# Patient Record
Sex: Female | Born: 1975 | Hispanic: Yes | Marital: Single | Smoking: Never smoker
Health system: Southern US, Community
[De-identification: ages and names within clinical notes are randomized; demographics above are authoritative.]

## PROBLEM LIST (undated history)

## (undated) DIAGNOSIS — Z8619 Personal history of other infectious and parasitic diseases: Secondary | ICD-10-CM

## (undated) DIAGNOSIS — D259 Leiomyoma of uterus, unspecified: Secondary | ICD-10-CM

## (undated) DIAGNOSIS — Z8669 Personal history of other diseases of the nervous system and sense organs: Secondary | ICD-10-CM

## (undated) HISTORY — DX: Personal history of other diseases of the nervous system and sense organs: Z86.69

## (undated) HISTORY — DX: Personal history of other infectious and parasitic diseases: Z86.19

## (undated) HISTORY — DX: Leiomyoma of uterus, unspecified: D25.9

---

## 2015-03-24 ENCOUNTER — Telehealth: Payer: Self-pay | Admitting: Behavioral Health

## 2015-03-24 NOTE — Telephone Encounter (Signed)
Spoke with patient regarding pre-visit info, however the time of call was not convient for the patient. Patient verbalized she will return call later.

## 2015-03-25 ENCOUNTER — Encounter: Payer: Self-pay | Admitting: Family Medicine

## 2015-03-25 ENCOUNTER — Other Ambulatory Visit: Payer: Self-pay | Admitting: Family Medicine

## 2015-03-25 ENCOUNTER — Ambulatory Visit (INDEPENDENT_AMBULATORY_CARE_PROVIDER_SITE_OTHER): Payer: BLUE CROSS/BLUE SHIELD | Admitting: Family Medicine

## 2015-03-25 ENCOUNTER — Ambulatory Visit: Payer: Self-pay | Admitting: Family Medicine

## 2015-03-25 VITALS — BP 122/82 | HR 84 | Temp 97.9°F | Resp 16 | Ht 64.5 in | Wt 202.4 lb

## 2015-03-25 DIAGNOSIS — Z23 Encounter for immunization: Secondary | ICD-10-CM | POA: Diagnosis not present

## 2015-03-25 DIAGNOSIS — Z0000 Encounter for general adult medical examination without abnormal findings: Secondary | ICD-10-CM | POA: Diagnosis not present

## 2015-03-25 LAB — CBC WITH DIFFERENTIAL/PLATELET
BASOS ABS: 0 10*3/uL (ref 0.0–0.1)
Basophils Relative: 0.4 % (ref 0.0–3.0)
Eosinophils Absolute: 0.1 10*3/uL (ref 0.0–0.7)
Eosinophils Relative: 0.9 % (ref 0.0–5.0)
HCT: 36.3 % (ref 36.0–46.0)
Hemoglobin: 11.9 g/dL — ABNORMAL LOW (ref 12.0–15.0)
LYMPHS ABS: 2.8 10*3/uL (ref 0.7–4.0)
Lymphocytes Relative: 39.1 % (ref 12.0–46.0)
MCHC: 32.7 g/dL (ref 30.0–36.0)
MCV: 87.7 fl (ref 78.0–100.0)
MONO ABS: 0.4 10*3/uL (ref 0.1–1.0)
Monocytes Relative: 6.1 % (ref 3.0–12.0)
Neutro Abs: 3.8 10*3/uL (ref 1.4–7.7)
Neutrophils Relative %: 53.5 % (ref 43.0–77.0)
Platelets: 202 10*3/uL (ref 150.0–400.0)
RBC: 4.14 Mil/uL (ref 3.87–5.11)
RDW: 14 % (ref 11.5–15.5)
WBC: 7.1 10*3/uL (ref 4.0–10.5)

## 2015-03-25 LAB — VITAMIN D 25 HYDROXY (VIT D DEFICIENCY, FRACTURES): VITD: 12.92 ng/mL — ABNORMAL LOW (ref 30.00–100.00)

## 2015-03-25 LAB — LIPID PANEL
CHOLESTEROL: 174 mg/dL (ref 0–200)
HDL: 57.9 mg/dL (ref >39.00)
LDL CALC: 106 mg/dL — AB (ref 0–99)
NONHDL: 116.1
Total CHOL/HDL Ratio: 3
Triglycerides: 51 mg/dL (ref 0.0–149.0)
VLDL: 10.2 mg/dL (ref 0.0–40.0)

## 2015-03-25 LAB — BASIC METABOLIC PANEL
BUN: 12 mg/dL (ref 6–23)
CO2: 27 mEq/L (ref 19–32)
Calcium: 9.3 mg/dL (ref 8.4–10.5)
Chloride: 103 mEq/L (ref 96–112)
Creatinine, Ser: 0.84 mg/dL (ref 0.40–1.20)
GFR: 79.36 mL/min (ref >60.00)
Glucose, Bld: 77 mg/dL (ref 70–99)
POTASSIUM: 3.7 meq/L (ref 3.5–5.1)
Sodium: 136 mEq/L (ref 135–145)

## 2015-03-25 LAB — TSH: TSH: 1.57 u[IU]/mL (ref 0.35–4.50)

## 2015-03-25 LAB — HEPATIC FUNCTION PANEL
ALK PHOS: 47 U/L (ref 39–117)
ALT: 18 U/L (ref 0–35)
AST: 18 U/L (ref 0–37)
Albumin: 4.2 g/dL (ref 3.5–5.2)
BILIRUBIN DIRECT: 0.1 mg/dL (ref 0.0–0.3)
BILIRUBIN TOTAL: 1.2 mg/dL (ref 0.2–1.2)
Total Protein: 7.3 g/dL (ref 6.0–8.3)

## 2015-03-25 NOTE — Progress Notes (Deleted)
Pre visit review using our clinic review tool, if applicable. No additional management support is needed unless otherwise documented below in the visit note. 

## 2015-03-25 NOTE — Patient Instructions (Signed)
Follow up in 1 year or as needed We'll notify you of your lab results and make any changes if needed Keep up the good work on healthy, low carb diet and regular exercise- you're doing great! Call your GYN and schedule your mammo Call with any questions or concerns Welcome!  We're glad to have you!!!

## 2015-03-25 NOTE — Progress Notes (Signed)
   Subjective:    Patient ID: Felicia Gutierrez, female    DOB: 04-11-1976, 39 y.o.   MRN: [id number]  HPI New to establish.  Previous MD- none  GYN- [facility], UTD on pap, has never had mammo.  No concerns today.   Review of Systems Patient reports no vision/ hearing changes, adenopathy,fever, weight change,  persistant/recurrent hoarseness , swallowing issues, chest pain, palpitations, edema, persistant/recurrent cough, hemoptysis, dyspnea (rest/exertional/paroxysmal nocturnal), gastrointestinal bleeding (melena, rectal bleeding), abdominal pain, significant heartburn, bowel changes, GU symptoms (dysuria, hematuria, incontinence), Gyn symptoms (abnormal  bleeding, pain),  syncope, focal weakness, memory loss, numbness & tingling, skin/hair/nail changes, abnormal bruising or bleeding, anxiety, or depression.     Objective:   Physical Exam General Appearance:    Alert, cooperative, no distress, appears stated age  Head:    Normocephalic, without obvious abnormality, atraumatic  Eyes:    PERRL, conjunctiva/corneas clear, EOM's intact, fundi    benign, both eyes  Ears:    Normal TM's and external ear canals, both ears  Nose:   Nares normal, septum midline, mucosa normal, no drainage    or sinus tenderness  Throat:   Lips, mucosa, and tongue normal; teeth and gums normal  Neck:   Supple, symmetrical, trachea midline, no adenopathy;    Thyroid: no enlargement/tenderness/nodules  Back:     Symmetric, no curvature, ROM normal, no CVA tenderness  Lungs:     Clear to auscultation bilaterally, respirations unlabored  Chest Wall:    No tenderness or deformity   Heart:    Regular rate and rhythm, S1 and S2 normal, no murmur, rub   or gallop  Breast Exam:    Deferred to GYN  Abdomen:     Soft, non-tender, bowel sounds active all four quadrants,    no masses, no organomegaly  Genitalia:    Deferred to GYN  Rectal:    Extremities:   Extremities normal, atraumatic, no cyanosis or edema  Pulses:   2+  and symmetric all extremities  Skin:   Skin color, texture, turgor normal, no rashes or lesions  Lymph nodes:   Cervical, supraclavicular, and axillary nodes normal  Neurologic:   CNII-XII intact, normal strength, sensation and reflexes    throughout          Assessment & Plan:

## 2015-03-25 NOTE — Progress Notes (Signed)
Pre visit review using our clinic review tool, if applicable. No additional management support is needed unless otherwise documented below in the visit note. 

## 2015-03-27 DIAGNOSIS — Z0000 Encounter for general adult medical examination without abnormal findings: Secondary | ICD-10-CM | POA: Insufficient documentation

## 2015-03-27 NOTE — Assessment & Plan Note (Signed)
Pt's PE WNL.  UTD on GYN (pap) but has never had a mammo.  Encouraged pt to call and schedule w/ GYN.  Check labs.  Anticipatory guidance provided.

## 2015-03-28 ENCOUNTER — Other Ambulatory Visit: Payer: Self-pay | Admitting: General Practice

## 2015-03-28 MED ORDER — VITAMIN D (ERGOCALCIFEROL) 1.25 MG (50000 UNIT) PO CAPS: 50000.0000 [IU] | ORAL_CAPSULE | ORAL | Status: DC

## 2016-03-30 ENCOUNTER — Ambulatory Visit (INDEPENDENT_AMBULATORY_CARE_PROVIDER_SITE_OTHER): Payer: BLUE CROSS/BLUE SHIELD | Admitting: Family Medicine

## 2016-03-30 ENCOUNTER — Encounter: Payer: Self-pay | Admitting: Family Medicine

## 2016-03-30 VITALS — BP 118/80 | HR 80 | Temp 98.0°F | Resp 16 | Ht 65.0 in | Wt 198.2 lb

## 2016-03-30 DIAGNOSIS — Z0000 Encounter for general adult medical examination without abnormal findings: Secondary | ICD-10-CM

## 2016-03-30 DIAGNOSIS — Z1231 Encounter for screening mammogram for malignant neoplasm of breast: Secondary | ICD-10-CM | POA: Diagnosis not present

## 2016-03-30 LAB — BASIC METABOLIC PANEL
BUN: 18 mg/dL (ref 6–23)
CO2: 27 mEq/L (ref 19–32)
Calcium: 9.6 mg/dL (ref 8.4–10.5)
Chloride: 103 mEq/L (ref 96–112)
Creatinine, Ser: 0.78 mg/dL (ref 0.40–1.20)
GFR: 86.02 mL/min (ref >60.00)
GLUCOSE: 88 mg/dL (ref 70–99)
POTASSIUM: 4.6 meq/L (ref 3.5–5.1)
Sodium: 136 mEq/L (ref 135–145)

## 2016-03-30 LAB — CBC WITH DIFFERENTIAL/PLATELET
Basophils Absolute: 0 10*3/uL (ref 0.0–0.1)
Basophils Relative: 0.3 % (ref 0.0–3.0)
EOS PCT: 1 % (ref 0.0–5.0)
Eosinophils Absolute: 0.1 10*3/uL (ref 0.0–0.7)
HCT: 36.8 % (ref 36.0–46.0)
Hemoglobin: 12.2 g/dL (ref 12.0–15.0)
LYMPHS ABS: 2.3 10*3/uL (ref 0.7–4.0)
Lymphocytes Relative: 39 % (ref 12.0–46.0)
MCHC: 33.1 g/dL (ref 30.0–36.0)
MCV: 87.6 fl (ref 78.0–100.0)
Monocytes Absolute: 0.4 10*3/uL (ref 0.1–1.0)
Monocytes Relative: 6.9 % (ref 3.0–12.0)
NEUTROS PCT: 52.8 % (ref 43.0–77.0)
Neutro Abs: 3.1 10*3/uL (ref 1.4–7.7)
Platelets: 180 10*3/uL (ref 150.0–400.0)
RBC: 4.2 Mil/uL (ref 3.87–5.11)
RDW: 14.4 % (ref 11.5–15.5)
WBC: 5.8 10*3/uL (ref 4.0–10.5)

## 2016-03-30 LAB — LIPID PANEL
CHOLESTEROL: 197 mg/dL (ref 0–200)
HDL: 62.5 mg/dL (ref >39.00)
LDL Cholesterol: 122 mg/dL — ABNORMAL HIGH (ref 0–99)
NONHDL: 134.42
Total CHOL/HDL Ratio: 3
Triglycerides: 61 mg/dL (ref 0.0–149.0)
VLDL: 12.2 mg/dL (ref 0.0–40.0)

## 2016-03-30 LAB — HEPATIC FUNCTION PANEL
ALT: 19 U/L (ref 0–35)
AST: 17 U/L (ref 0–37)
Albumin: 4.3 g/dL (ref 3.5–5.2)
Alkaline Phosphatase: 47 U/L (ref 39–117)
BILIRUBIN DIRECT: 0.1 mg/dL (ref 0.0–0.3)
BILIRUBIN TOTAL: 0.7 mg/dL (ref 0.2–1.2)
Total Protein: 7.3 g/dL (ref 6.0–8.3)

## 2016-03-30 LAB — VITAMIN D 25 HYDROXY (VIT D DEFICIENCY, FRACTURES): VITD: 21.25 ng/mL — ABNORMAL LOW (ref 30.00–100.00)

## 2016-03-30 LAB — TSH: TSH: 2.39 u[IU]/mL (ref 0.35–4.50)

## 2016-03-30 NOTE — Progress Notes (Signed)
Pre visit review using our clinic review tool, if applicable. No additional management support is needed unless otherwise documented below in the visit note. 

## 2016-03-30 NOTE — Patient Instructions (Signed)
Follow up in 1 year or as needed We'll notify you of your lab results and make any changes if needed Continue to work on healthy diet and regular exercise- you look great! Call with any questions or concerns Have a great summer!! 

## 2016-03-30 NOTE — Assessment & Plan Note (Signed)
Pt's PE WNL.  Due for pap- pt to schedule w/ GYN.  Due for mammo- referral placed.  Check labs.  Anticipatory guidance provided.

## 2016-03-30 NOTE — Progress Notes (Signed)
   Subjective:    Patient ID: Felicia Gutierrez, female    DOB: Jun 21, 1976, 40 y.o.   MRN: [id number]  HPI CPE- UTD on pap smear (Dr Owens Shark), due for [facility].   Review of Systems Patient reports no vision/ hearing changes, adenopathy,fever, weight change,  persistant/recurrent hoarseness , swallowing issues, chest pain, palpitations, edema, persistant/recurrent cough, hemoptysis, dyspnea (rest/exertional/paroxysmal nocturnal), gastrointestinal bleeding (melena, rectal bleeding), abdominal pain, significant heartburn, bowel changes, GU symptoms (dysuria, hematuria, incontinence), Gyn symptoms (abnormal  bleeding, pain),  syncope, focal weakness, memory loss, numbness & tingling, skin/hair/nail changes, abnormal bruising or bleeding, anxiety, or depression.     Objective:   Physical Exam General Appearance:    Alert, cooperative, no distress, appears stated age  Head:    Normocephalic, without obvious abnormality, atraumatic  Eyes:    PERRL, conjunctiva/corneas clear, EOM's intact, fundi    benign, both eyes  Ears:    Normal TM's and external ear canals, both ears  Nose:   Nares normal, septum midline, mucosa normal, no drainage    or sinus tenderness  Throat:   Lips, mucosa, and tongue normal; teeth and gums normal  Neck:   Supple, symmetrical, trachea midline, no adenopathy;    Thyroid: no enlargement/tenderness/nodules  Back:     Symmetric, no curvature, ROM normal, no CVA tenderness  Lungs:     Clear to auscultation bilaterally, respirations unlabored  Chest Wall:    No tenderness or deformity   Heart:    Regular rate and rhythm, S1 and S2 normal, no murmur, rub   or gallop  Breast Exam:    Deferred to GYN  Abdomen:     Soft, non-tender, bowel sounds active all four quadrants,    no masses, no organomegaly  Genitalia:    Deferred to GYN  Rectal:    Extremities:   Extremities normal, atraumatic, no cyanosis or edema  Pulses:   2+ and symmetric all extremities  Skin:   Skin  color, texture, turgor normal, no rashes or lesions  Lymph nodes:   Cervical, supraclavicular, and axillary nodes normal  Neurologic:   CNII-XII intact, normal strength, sensation and reflexes    throughout          Assessment & Plan:

## 2016-04-04 ENCOUNTER — Other Ambulatory Visit: Payer: Self-pay | Admitting: General Practice

## 2016-04-04 MED ORDER — VITAMIN D (ERGOCALCIFEROL) 1.25 MG (50000 UNIT) PO CAPS: 50000.0000 [IU] | ORAL_CAPSULE | ORAL | Status: DC

## 2016-04-04 NOTE — Progress Notes (Signed)
Quick Note:  Called pt and lmovm to return call. ______ 

## 2016-04-20 ENCOUNTER — Ambulatory Visit
Admission: RE | Admit: 2016-04-20 | Discharge: 2016-04-20 | Disposition: A | Discharge status: Home / Self Care | Payer: BLUE CROSS/BLUE SHIELD | Source: Ambulatory Visit | Attending: Family Medicine | Admitting: Family Medicine

## 2016-04-20 DIAGNOSIS — Z1231 Encounter for screening mammogram for malignant neoplasm of breast: Secondary | ICD-10-CM

## 2017-04-01 ENCOUNTER — Encounter: Payer: BLUE CROSS/BLUE SHIELD | Admitting: Family Medicine

## 2017-04-05 ENCOUNTER — Encounter: Payer: BLUE CROSS/BLUE SHIELD | Admitting: Family Medicine

## 2017-04-26 ENCOUNTER — Ambulatory Visit (INDEPENDENT_AMBULATORY_CARE_PROVIDER_SITE_OTHER): Payer: BLUE CROSS/BLUE SHIELD | Admitting: Family Medicine

## 2017-04-26 ENCOUNTER — Encounter: Payer: Self-pay | Admitting: Family Medicine

## 2017-04-26 VITALS — BP 118/78 | HR 79 | Temp 98.1°F | Resp 16 | Ht 65.0 in | Wt 203.4 lb

## 2017-04-26 DIAGNOSIS — E559 Vitamin D deficiency, unspecified: Secondary | ICD-10-CM | POA: Diagnosis not present

## 2017-04-26 DIAGNOSIS — E669 Obesity, unspecified: Secondary | ICD-10-CM | POA: Diagnosis not present

## 2017-04-26 DIAGNOSIS — Z0000 Encounter for general adult medical examination without abnormal findings: Secondary | ICD-10-CM | POA: Diagnosis not present

## 2017-04-26 DIAGNOSIS — E010 Iodine-deficiency related diffuse (endemic) goiter: Secondary | ICD-10-CM | POA: Diagnosis not present

## 2017-04-26 LAB — VITAMIN D 25 HYDROXY (VIT D DEFICIENCY, FRACTURES): VITD: 21.08 ng/mL — AB (ref 30.00–100.00)

## 2017-04-26 LAB — BASIC METABOLIC PANEL
BUN: 15 mg/dL (ref 6–23)
CHLORIDE: 104 meq/L (ref 96–112)
CO2: 26 meq/L (ref 19–32)
Calcium: 9.5 mg/dL (ref 8.4–10.5)
Creatinine, Ser: 0.84 mg/dL (ref 0.40–1.20)
GFR: 78.57 mL/min (ref >60.00)
GLUCOSE: 83 mg/dL (ref 70–99)
POTASSIUM: 4.4 meq/L (ref 3.5–5.1)
SODIUM: 137 meq/L (ref 135–145)

## 2017-04-26 LAB — CBC WITH DIFFERENTIAL/PLATELET
Basophils Absolute: 0.1 10*3/uL (ref 0.0–0.1)
Basophils Relative: 1.3 % (ref 0.0–3.0)
EOS ABS: 0.1 10*3/uL (ref 0.0–0.7)
EOS PCT: 1.5 % (ref 0.0–5.0)
HEMATOCRIT: 36.6 % (ref 36.0–46.0)
HEMOGLOBIN: 11.8 g/dL — AB (ref 12.0–15.0)
LYMPHS ABS: 2.2 10*3/uL (ref 0.7–4.0)
Lymphocytes Relative: 44.1 % (ref 12.0–46.0)
MCHC: 32.3 g/dL (ref 30.0–36.0)
MCV: 90.8 fl (ref 78.0–100.0)
MONO ABS: 0.4 10*3/uL (ref 0.1–1.0)
MONOS PCT: 7.6 % (ref 3.0–12.0)
NEUTROS ABS: 2.3 10*3/uL (ref 1.4–7.7)
Neutrophils Relative %: 45.5 % (ref 43.0–77.0)
Platelets: 187 10*3/uL (ref 150.0–400.0)
RBC: 4.03 Mil/uL (ref 3.87–5.11)
RDW: 14.4 % (ref 11.5–15.5)
WBC: 5.1 10*3/uL (ref 4.0–10.5)

## 2017-04-26 LAB — HEPATIC FUNCTION PANEL
ALBUMIN: 4.1 g/dL (ref 3.5–5.2)
ALK PHOS: 43 U/L (ref 39–117)
ALT: 14 U/L (ref 0–35)
AST: 13 U/L (ref 0–37)
BILIRUBIN DIRECT: 0.1 mg/dL (ref 0.0–0.3)
TOTAL PROTEIN: 6.9 g/dL (ref 6.0–8.3)
Total Bilirubin: 0.7 mg/dL (ref 0.2–1.2)

## 2017-04-26 LAB — LIPID PANEL
Cholesterol: 163 mg/dL (ref 0–200)
HDL: 55.5 mg/dL (ref >39.00)
LDL Cholesterol: 100 mg/dL — ABNORMAL HIGH (ref 0–99)
NonHDL: 107.95
Total CHOL/HDL Ratio: 3
Triglycerides: 40 mg/dL (ref 0.0–149.0)
VLDL: 8 mg/dL (ref 0.0–40.0)

## 2017-04-26 LAB — TSH: TSH: 2.07 u[IU]/mL (ref 0.35–4.50)

## 2017-04-26 NOTE — Progress Notes (Signed)
   Subjective:    Patient ID: Felicia Gutierrez, female    DOB: 08/08/1976, 41 y.o.   MRN: [id number]  HPI CPE- UTD on Tdap, due for mammo and pap [facility]).  Pt has hx of Vit D deficiency.   Review of Systems Patient reports no vision/ hearing changes, adenopathy,fever, weight change,  persistant/recurrent hoarseness , swallowing issues, chest pain, palpitations, edema, persistant/recurrent cough, hemoptysis, dyspnea (rest/exertional/paroxysmal nocturnal), gastrointestinal bleeding (melena, rectal bleeding), abdominal pain, significant heartburn, bowel changes, GU symptoms (dysuria, hematuria, incontinence), Gyn symptoms (abnormal  bleeding, pain),  syncope, focal weakness, memory loss, numbness & tingling, skin/hair/nail changes, abnormal bruising or bleeding, anxiety, or depression.     Objective:   Physical Exam General Appearance:    Alert, cooperative, no distress, appears stated age  Head:    Normocephalic, without obvious abnormality, atraumatic  Eyes:    PERRL, conjunctiva/corneas clear, EOM's intact, fundi    benign, both eyes  Ears:    Normal TM's and external ear canals, both ears  Nose:   Nares normal, septum midline, mucosa normal, no drainage    or sinus tenderness  Throat:   Lips, mucosa, and tongue normal; teeth and gums normal  Neck:   Supple, symmetrical, trachea midline, no adenopathy;    Thyroid: thyromegaly w/o nodularity  Back:     Symmetric, no curvature, ROM normal, no CVA tenderness  Lungs:     Clear to auscultation bilaterally, respirations unlabored  Chest Wall:    No tenderness or deformity   Heart:    Regular rate and rhythm, S1 and S2 normal, no murmur, rub   or gallop  Breast Exam:    Deferred to GYN  Abdomen:     Soft, non-tender, bowel sounds active all four quadrants,    no masses, no organomegaly  Genitalia:    Deferred to GYN  Rectal:    Extremities:   Extremities normal, atraumatic, no cyanosis or edema  Pulses:   2+ and symmetric  all extremities  Skin:   Skin color, texture, turgor normal, no rashes or lesions  Lymph nodes:   Cervical, supraclavicular, and axillary nodes normal  Neurologic:   CNII-XII intact, normal strength, sensation and reflexes    throughout          Assessment & Plan:

## 2017-04-26 NOTE — Patient Instructions (Signed)
Follow up in 1 year or as needed We'll notify you of your lab results and make any changes if needed We'll call you with your thyroid US appt at our [place] in [place] Call and schedule your pap and mammo Continue to work on healthy diet and regular exercise- you can do it! Call with any questions or concerns CONGRATS ON THE PROMOTION!!!

## 2017-04-26 NOTE — Assessment & Plan Note (Signed)
Pt's PE WNL w/ exception of thyromegaly and obesity.  Overdue on GYN- pt to call and schedule.  Check labs.  Anticipatory guidance provided.

## 2017-04-26 NOTE — Assessment & Plan Note (Signed)
Pt has gained weight since last visit.  Encouraged healthy diet and regular exercise.  Check labs to risk stratify.  Will follow.

## 2017-04-26 NOTE — Assessment & Plan Note (Signed)
Pt has hx of this.  Check labs.  Replete prn. 

## 2017-04-26 NOTE — Progress Notes (Signed)
Pre visit review using our clinic review tool, if applicable. No additional management support is needed unless otherwise documented below in the visit note. 

## 2017-04-29 ENCOUNTER — Other Ambulatory Visit: Payer: Self-pay | Admitting: General Practice

## 2017-04-29 MED ORDER — VITAMIN D (ERGOCALCIFEROL) 1.25 MG (50000 UNIT) PO CAPS: 50000.0000 [IU] | ORAL_CAPSULE | ORAL | 0 refills | Status: DC

## 2017-05-01 ENCOUNTER — Ambulatory Visit (INDEPENDENT_AMBULATORY_CARE_PROVIDER_SITE_OTHER): Payer: BLUE CROSS/BLUE SHIELD

## 2017-05-01 DIAGNOSIS — E010 Iodine-deficiency related diffuse (endemic) goiter: Secondary | ICD-10-CM

## 2017-07-21 ENCOUNTER — Other Ambulatory Visit: Payer: Self-pay | Admitting: Family Medicine

## 2018-04-01 ENCOUNTER — Other Ambulatory Visit: Payer: Self-pay | Admitting: Family Medicine

## 2018-04-01 DIAGNOSIS — Z1231 Encounter for screening mammogram for malignant neoplasm of breast: Secondary | ICD-10-CM

## 2018-04-28 ENCOUNTER — Other Ambulatory Visit: Payer: Self-pay

## 2018-04-28 ENCOUNTER — Ambulatory Visit (INDEPENDENT_AMBULATORY_CARE_PROVIDER_SITE_OTHER): Payer: BLUE CROSS/BLUE SHIELD | Admitting: Family Medicine

## 2018-04-28 ENCOUNTER — Other Ambulatory Visit (HOSPITAL_COMMUNITY)
Admission: RE | Admit: 2018-04-28 | Discharge: 2018-04-28 | Disposition: A | Discharge status: Home / Self Care | Payer: BLUE CROSS/BLUE SHIELD | Source: Ambulatory Visit | Attending: Family Medicine | Admitting: Family Medicine

## 2018-04-28 ENCOUNTER — Ambulatory Visit
Admission: RE | Admit: 2018-04-28 | Discharge: 2018-04-28 | Disposition: A | Discharge status: Home / Self Care | Payer: BLUE CROSS/BLUE SHIELD | Source: Ambulatory Visit | Attending: Family Medicine | Admitting: Family Medicine

## 2018-04-28 ENCOUNTER — Encounter: Payer: Self-pay | Admitting: Family Medicine

## 2018-04-28 VITALS — BP 118/74 | HR 76 | Temp 97.6°F | Ht 64.0 in | Wt 182.4 lb

## 2018-04-28 DIAGNOSIS — Z1231 Encounter for screening mammogram for malignant neoplasm of breast: Secondary | ICD-10-CM | POA: Diagnosis not present

## 2018-04-28 DIAGNOSIS — Z124 Encounter for screening for malignant neoplasm of cervix: Secondary | ICD-10-CM | POA: Insufficient documentation

## 2018-04-28 DIAGNOSIS — Z0000 Encounter for general adult medical examination without abnormal findings: Secondary | ICD-10-CM | POA: Diagnosis not present

## 2018-04-28 DIAGNOSIS — Z3009 Encounter for other general counseling and advice on contraception: Secondary | ICD-10-CM | POA: Diagnosis not present

## 2018-04-28 DIAGNOSIS — Z975 Presence of (intrauterine) contraceptive device: Secondary | ICD-10-CM

## 2018-04-28 LAB — CBC WITH DIFFERENTIAL/PLATELET
BASOS ABS: 0 10*3/uL (ref 0.0–0.1)
BASOS PCT: 0.5 % (ref 0.0–3.0)
EOS PCT: 0.7 % (ref 0.0–5.0)
Eosinophils Absolute: 0 10*3/uL (ref 0.0–0.7)
HCT: 38.5 % (ref 36.0–46.0)
Hemoglobin: 12.8 g/dL (ref 12.0–15.0)
LYMPHS ABS: 2.3 10*3/uL (ref 0.7–4.0)
Lymphocytes Relative: 46.4 % — ABNORMAL HIGH (ref 12.0–46.0)
MCHC: 33.2 g/dL (ref 30.0–36.0)
MCV: 90.6 fl (ref 78.0–100.0)
MONOS PCT: 7 % (ref 3.0–12.0)
Monocytes Absolute: 0.3 10*3/uL (ref 0.1–1.0)
NEUTROS ABS: 2.3 10*3/uL (ref 1.4–7.7)
NEUTROS PCT: 45.4 % (ref 43.0–77.0)
PLATELETS: 156 10*3/uL (ref 150.0–400.0)
RBC: 4.25 Mil/uL (ref 3.87–5.11)
RDW: 13.6 % (ref 11.5–15.5)
WBC: 5 10*3/uL (ref 4.0–10.5)

## 2018-04-28 NOTE — Patient Instructions (Addendum)
Please return in 12 months for your annual complete physical; please come fasting, with Dr. Birdie Riddle  We will call you with information regarding your referral appointment. [facility] for IUD removal and replacement.  If you do not hear from [place] within the next 2 weeks, please let me know. It can take 1-2 weeks to get appointments set up with the specialists.    If you have any questions or concerns, please don't hesitate to send me a message via MyChart or call the office at [phone]. Thank you for visiting with [place] today! It's our pleasure caring for you.  Please do these things to maintain good health!   Exercise at least 30-45 minutes a day,  4-5 days a week.   Eat a low-fat diet with lots of fruits and vegetables, up to 7-9 servings per day.  Drink plenty of water daily. Try to drink 8 8oz glasses per day.  Seatbelts can save your life. Always wear your seatbelt.  Place Smoke Detectors on every level of your home and check batteries every year.  Schedule an appointment with an eye doctor for an eye exam every 1-2 years  Safe sex - use condoms to protect yourself from STDs if you could be exposed to these types of infections. Use birth control if you do not want to become pregnant and are sexually active.  Avoid heavy alcohol use. If you drink, keep it to less than 2 drinks/day and not every day.  [place].  Choose someone you trust that could speak for you if you became unable to speak for yourself.  Depression is common in our stressful world.If you're feeling down or losing interest in things you normally enjoy, please come in for a visit.  If anyone is threatening or hurting you, please get help. Physical or Emotional Violence is never OK.

## 2018-04-28 NOTE — Progress Notes (Signed)
Subjective  Chief Complaint  Patient presents with  . Annual Exam    doing well, no complaints. patient is fasting. Pap today     HPI: Felicia Gutierrez is a 42 y.o. female who presents to [place] [facility] today for a Female Wellness Visit.   Wellness Visit: annual visit with health maintenance review and exam with Pap   Pt of Dr. Birdie Riddle; doing well w/o concerns. Due for pap. Reports has mirena IUD in place; due out last year. Would like replacement. Has 2 teen boys.   Assessment  1. Annual physical exam   2. Cervical cancer screening   3. General counseling and advice for contraceptive management   4. IUD (intrauterine device) in place      Plan  Female Wellness Visit:  Age appropriate Health Maintenance and Prevention measures were discussed with patient. Included topics are cancer screening recommendations, ways to keep healthy (see AVS) including dietary and exercise recommendations, regular eye and dental care, use of seat belts, and avoidance of moderate alcohol use and tobacco use. Await pap with HPV screen. Mammogram scheduled for tomorrow.   BMI: discussed patient's BMI and encouraged positive lifestyle modifications to help get to or maintain a target BMI.  HM needs and immunizations were addressed and ordered. See below for orders. See HM and immunization section for updates.  Routine labs and screening tests ordered including cmp, cbc and lipids where appropriate.  Discussed recommendations regarding Vit D and calcium supplementation (see AVS)  rec Gyn appt for IUD removal and replacement since can't see strings. Discussed need for back up method in the meantime.   Follow up: Return in about 1 year (around 04/29/2019) for complete physical.   Orders Placed This Encounter  Procedures  . CBC with Differential/Platelet  . Comprehensive metabolic panel  . Lipid panel  . Ambulatory referral to Obstetrics / Gynecology   No orders  of the defined types were placed in this encounter.     Lifestyle: Body mass index is 31.31 kg/m. Wt Readings from Last 3 Encounters:  04/28/18 182 lb 6.4 oz (82.7 kg)  04/26/17 203 lb 6 oz (92.3 kg)  03/30/16 198 lb 4 oz (89.9 kg)   Diet: general Exercise: infrequently,  Need for contraception: Yes, IUD  Patient Active Problem List   Diagnosis Date Noted  . Vitamin D deficiency 04/26/2017  . Obesity (BMI 30-39.9) 04/26/2017  . Physical exam 03/27/2015   Health Maintenance  Topic Date Due  . PAP SMEAR  10/02/2015  . MAMMOGRAM  04/20/2017  . INFLUENZA VACCINE  05/01/2018  . TETANUS/TDAP  03/24/2025  . HIV Screening  Completed   Immunization History  Administered Date(s) Administered  . Tdap 03/25/2015   We updated and reviewed the patient's past history in detail and it is documented below. Allergies: Patient has No Known Allergies. Past Medical History Patient  has a past medical history of Fibroid uterus, History of chicken pox, and migraines. Past Surgical History Patient  has no past surgical history on file. Family History: Patient family history includes Cancer in her maternal aunt and mother; Cancer (age of onset: 8) in her sister; Diabetes in her maternal aunt; Heart disease in her mother; Hypertension in her father. Social History:  Patient  reports that she has never smoked. She has never used smokeless tobacco. She reports that she drinks alcohol. She reports that she does not use drugs.  Review of Systems: Constitutional: negative for fever or malaise Ophthalmic: negative for photophobia,  double vision or loss of vision Cardiovascular: negative for chest pain, dyspnea on exertion, or new LE swelling Respiratory: negative for SOB or persistent cough Gastrointestinal: negative for abdominal pain, change in bowel habits or melena Genitourinary: negative for dysuria or gross hematuria, no abnormal uterine bleeding or disharge Musculoskeletal: negative for  new gait disturbance or muscular weakness Integumentary: negative for new or persistent rashes, no breast lumps Neurological: negative for TIA or stroke symptoms Psychiatric: negative for SI or delusions Allergic/Immunologic: negative for hives Patient Care Team    Relationship Specialty Notifications Start End  Midge Minium, MD PCP - General Family Medicine  01/07/15   Doroteo Glassman, MD Referring Physician Obstetrics and Gynecology  03/25/15     Objective  Vitals: BP 118/74   Pulse 76   Temp 97.6 F (36.4 C)   Ht 5\' 4"  (1.626 m)   Wt 182 lb 6.4 oz (82.7 kg)   LMP 04/13/2018   SpO2 99%   BMI 31.31 kg/m  General:  Well developed, well nourished, no acute distress  Psych:  Alert and orientedx3,normal mood and affect HEENT:  Normocephalic, atraumatic, non-icteric sclera, PERRL, oropharynx is clear without mass or exudate, supple neck without adenopathy, mass or thyromegaly Cardiovascular:  Normal S1, S2, RRR without gallop, rub or murmur, nondisplaced PMI Respiratory:  Good breath sounds bilaterally, CTAB with normal respiratory effort Gastrointestinal: normal bowel sounds, soft, non-tender, no noted masses. No HSM MSK: no deformities, contusions. Joints are without erythema or swelling. Spine and CVA region are nontender Skin:  Warm, no rashes or suspicious lesions noted Neurologic:    Mental status is normal. CN 2-11 are normal. Gross motor and sensory exams are normal. Normal gait. No tremor Breast Exam: No mass, skin retraction or nipple discharge is appreciated in either breast. No axillary adenopathy. Fibrocystic changes are not noted Pelvic Exam: Normal external genitalia, no vulvar or vaginal lesions present. Clear cervix w/o CMT. do not see IUD strings. Bimanual exam reveals a nontender fundus w/o masses, nl size. No adnexal masses present. No inguinal adenopathy. A PAP smear was not performed.    Commons side effects, risks, benefits, and alternatives for medications and  treatment plan prescribed today were discussed, and the patient expressed understanding of the given instructions. Patient is instructed to call or message via MyChart if he/she has any questions or concerns regarding our treatment plan. No barriers to understanding were identified. We discussed Red Flag symptoms and signs in detail. Patient expressed understanding regarding what to do in case of urgent or emergency type symptoms.   Medication list was reconciled, printed and provided to the patient in AVS. Patient instructions and summary information was reviewed with the patient as documented in the AVS. This note was prepared with assistance of Dragon voice recognition software. Occasional wrong-word or sound-a-like substitutions may have occurred due to the inherent limitations of voice recognition software

## 2018-04-29 ENCOUNTER — Telehealth: Payer: Self-pay | Admitting: General Practice

## 2018-04-29 LAB — CYTOLOGY - PAP
Diagnosis: NEGATIVE
HPV: NOT DETECTED

## 2018-04-29 NOTE — Telephone Encounter (Signed)
Called and spoke with pt. She asked if when pap results come back if they coud be faxed to [facility] in [facility] attn: Dr Grandville Silos. Pt has an appointment with them in December and they are requesting documentation of annual exam and pap for their records.   Copied from [place] [phone]. Topic: Referral - Status >> Apr 29, 2018  8:46 AM Bea Graff, NT wrote: Reason for CRM: Pt states she called her OBGYN and was to call the office when she did this. Requesting a callback.

## 2018-04-30 ENCOUNTER — Telehealth: Payer: Self-pay | Admitting: Emergency Medicine

## 2018-04-30 DIAGNOSIS — E669 Obesity, unspecified: Secondary | ICD-10-CM

## 2018-04-30 DIAGNOSIS — Z0000 Encounter for general adult medical examination without abnormal findings: Secondary | ICD-10-CM

## 2018-04-30 NOTE — Addendum Note (Signed)
Addended by: Leonidas Romberg on: 04/30/2018 09:47 AM   Modules accepted: Orders

## 2018-04-30 NOTE — Telephone Encounter (Signed)
Called patient and left a message on VM advising patient that the labs drawn on Monday were hemolyzed and needed to redrawn. She will not be charged for the redraw. Lab orders placed in chart. Patient just needs to schedule a lab appointment at our location or [facility] location or walk in at [place] location. CRM created and ok for PEC to advise and schedule appointment

## 2018-05-01 NOTE — Progress Notes (Signed)
Please call patient: I have reviewed his/her lab results. Please let patient know her blood counts and pap are both normal. Can repeat the pap smear in 5 years.  Can return for lipids and cmp as scheduled. Thanks.

## 2018-05-01 NOTE — Addendum Note (Signed)
Addended by: Leonidas Romberg on: 05/01/2018 01:50 PM   Modules accepted: Orders

## 2018-05-01 NOTE — Telephone Encounter (Signed)
Report Sent to [facility]

## 2018-05-01 NOTE — Telephone Encounter (Signed)
Spoke with patient and advised for her convience we can have labs drawn at [facility] in [facility].  She is agreeable and no appointment is needed. Reordered the labs for [facility] and will fax over to them.

## 2018-05-02 DIAGNOSIS — Z0000 Encounter for general adult medical examination without abnormal findings: Secondary | ICD-10-CM | POA: Diagnosis not present

## 2018-05-02 DIAGNOSIS — E669 Obesity, unspecified: Secondary | ICD-10-CM | POA: Diagnosis not present

## 2018-05-03 LAB — COMPREHENSIVE METABOLIC PANEL
ALK PHOS: 45 IU/L (ref 39–117)
ALT: 15 IU/L (ref 0–32)
AST: 19 IU/L (ref 0–40)
Albumin/Globulin Ratio: 1.7 (ref 1.2–2.2)
Albumin: 4.3 g/dL (ref 3.5–5.5)
BUN/Creatinine Ratio: 14 (ref 9–23)
BUN: 13 mg/dL (ref 6–24)
Bilirubin Total: 0.9 mg/dL (ref 0.0–1.2)
CO2: 20 mmol/L (ref 20–29)
CREATININE: 0.96 mg/dL (ref 0.57–1.00)
Calcium: 9.5 mg/dL (ref 8.7–10.2)
Chloride: 101 mmol/L (ref 96–106)
GFR calc Af Amer: 83 mL/min/{1.73_m2} (ref >59)
GFR calc non Af Amer: 72 mL/min/{1.73_m2} (ref >59)
GLUCOSE: 93 mg/dL (ref 65–99)
Globulin, Total: 2.6 g/dL (ref 1.5–4.5)
Potassium: 4.3 mmol/L (ref 3.5–5.2)
Sodium: 139 mmol/L (ref 134–144)
Total Protein: 6.9 g/dL (ref 6.0–8.5)

## 2018-05-03 LAB — LIPID PANEL
Chol/HDL Ratio: 2.4 ratio (ref 0.0–4.4)
Cholesterol, Total: 179 mg/dL (ref 100–199)
HDL: 76 mg/dL (ref >39)
LDL CALC: 93 mg/dL (ref 0–99)
Triglycerides: 52 mg/dL (ref 0–149)
VLDL CHOLESTEROL CAL: 10 mg/dL (ref 5–40)

## 2018-05-05 NOTE — Telephone Encounter (Signed)
Please call patient: I have reviewed his/her lab results. Thanks for returning for labs: all testing is Normal. Everything looks good!

## 2018-05-19 DIAGNOSIS — Z30431 Encounter for routine checking of intrauterine contraceptive device: Secondary | ICD-10-CM | POA: Diagnosis not present

## 2018-05-19 DIAGNOSIS — Z3202 Encounter for pregnancy test, result negative: Secondary | ICD-10-CM | POA: Diagnosis not present

## 2018-06-01 IMAGING — US US SOFT TISSUE HEAD/NECK
1 series · 14 of 25 positions shown · non-contrast
Comparison: None.

CLINICAL DATA: Thyromegaly on exam

EXAM:
THYROID ULTRASOUND
TECHNIQUE: Ultrasound examination of the thyroid gland and adjacent soft
tissues was performed.

[Series 1: us soft tissue head/neck · 0.05mm/px · 14 of 60 slices shown]
[im 1/60]
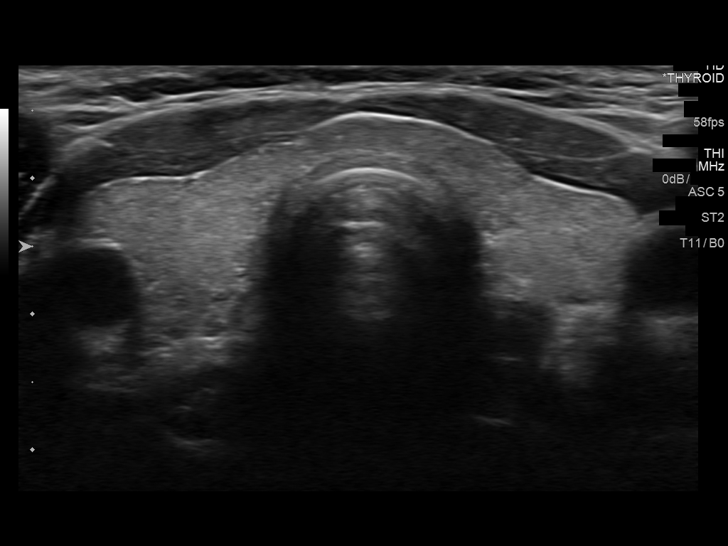
[im 5/60]
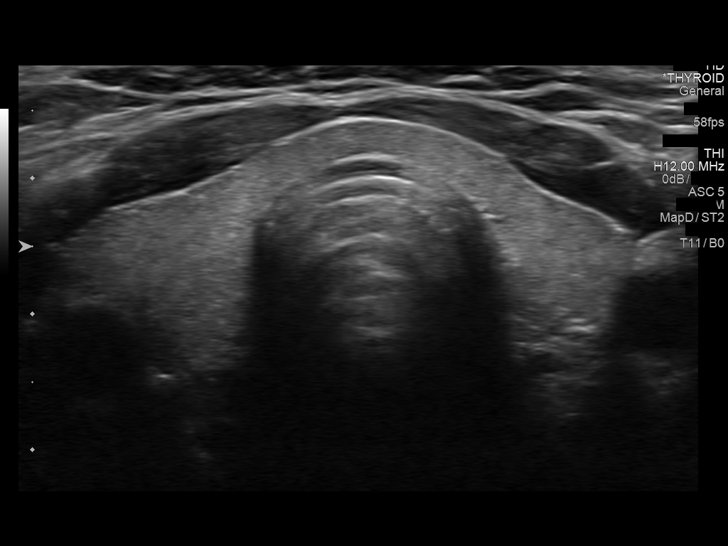
[im 10/60]
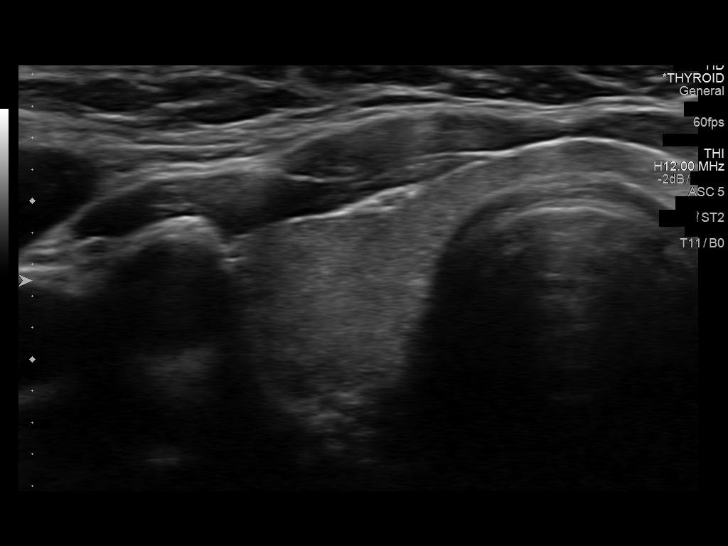
[im 15/60]
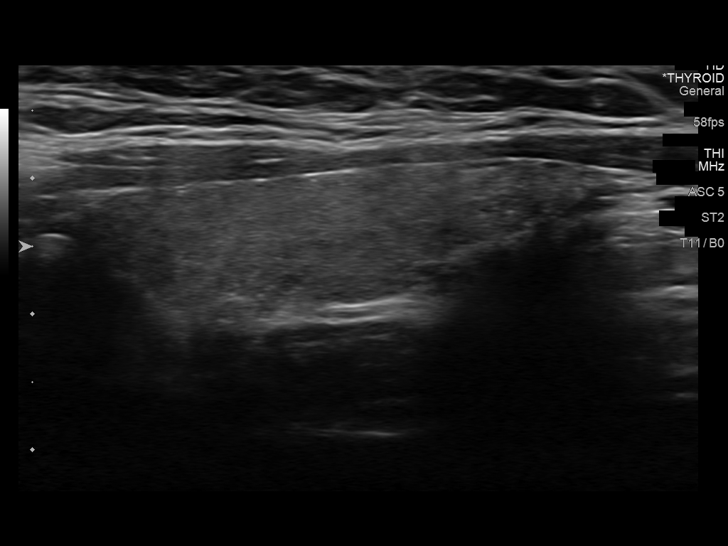
[im 20/60]
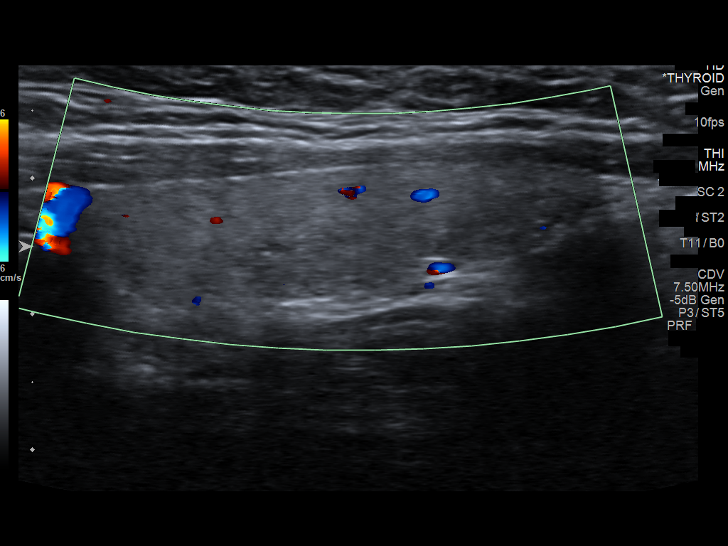
[im 23/60]
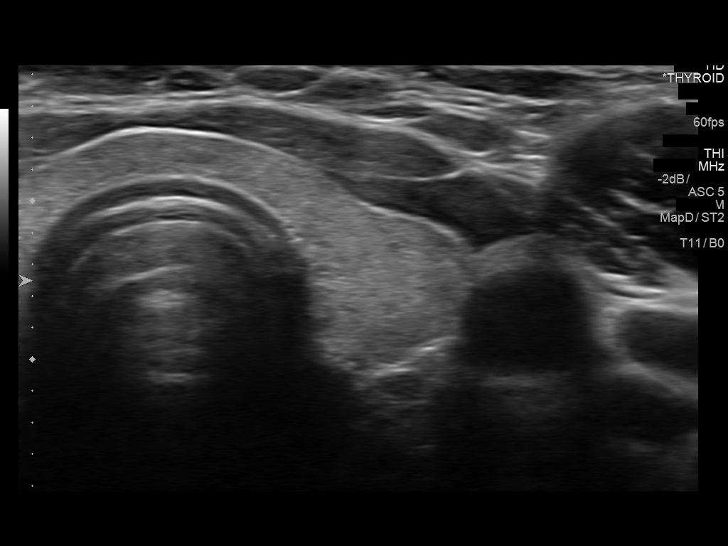
[im 28/60]
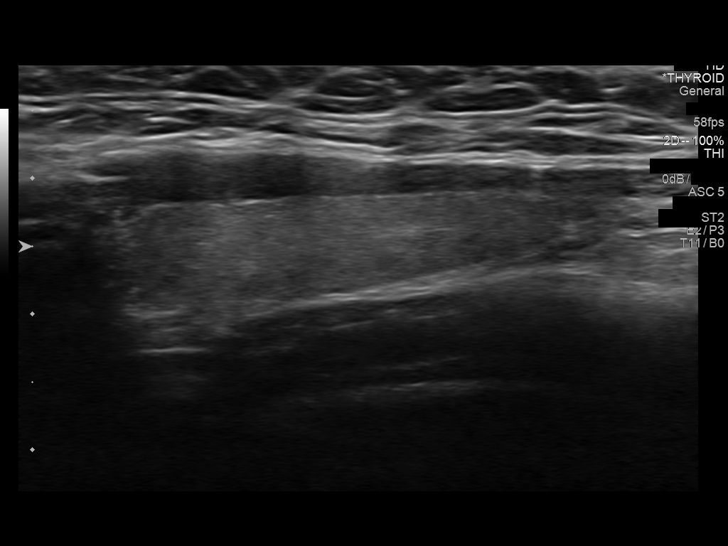
[im 32/60]
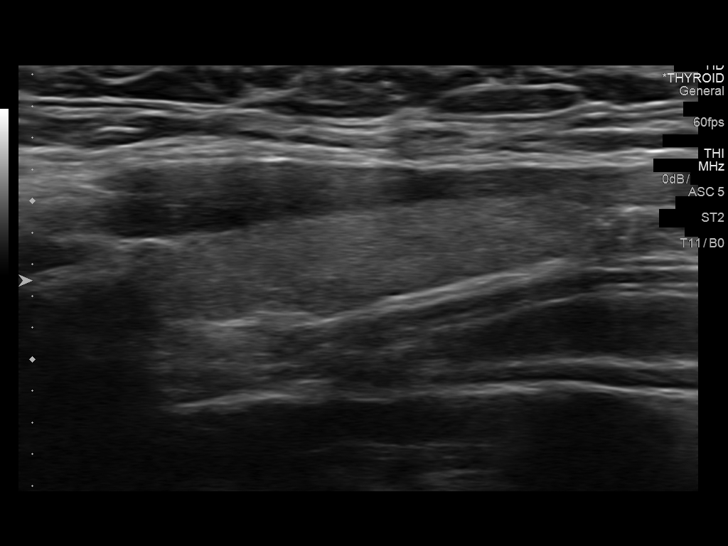
[im 37/60]
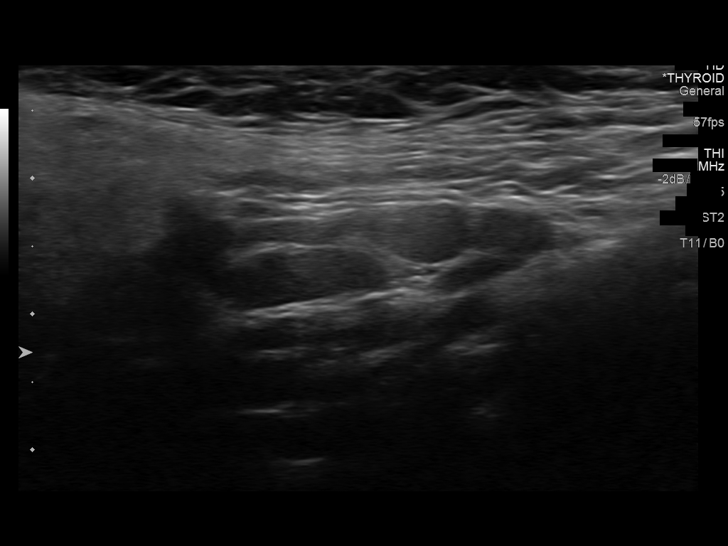
[im 40/60]
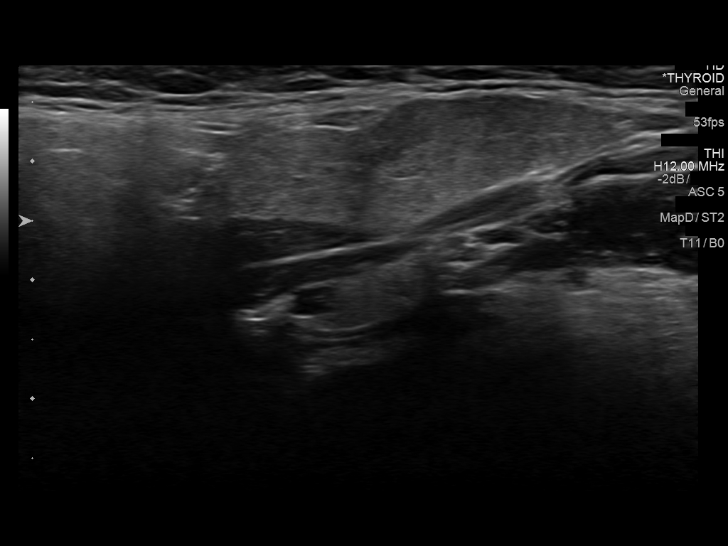
[im 45/60]
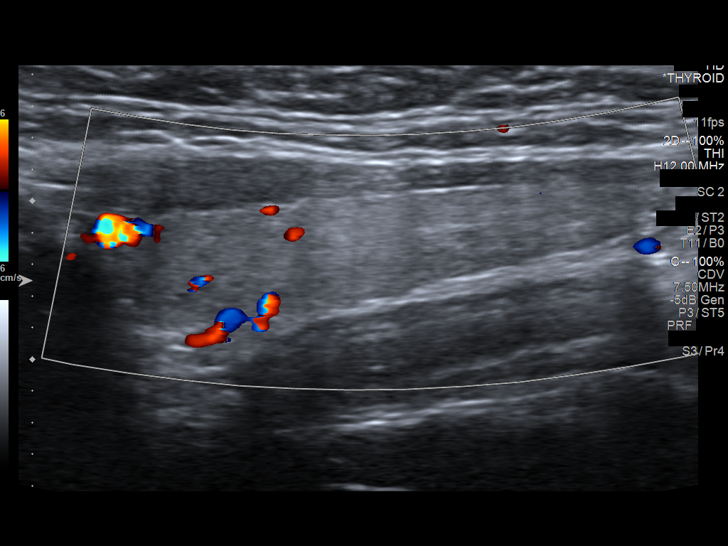
[im 50/60]
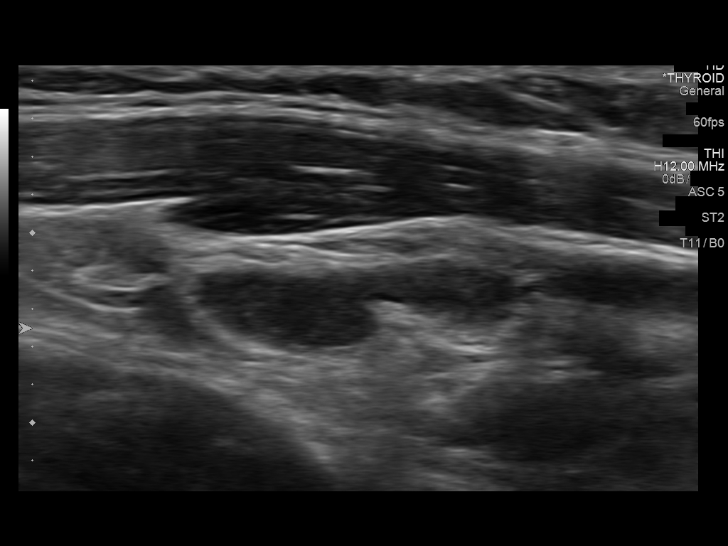
[im 55/60]
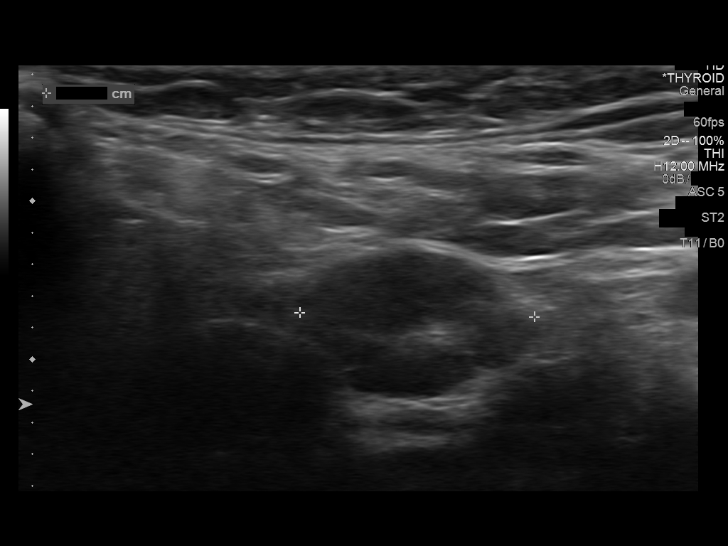
[im 60/60]
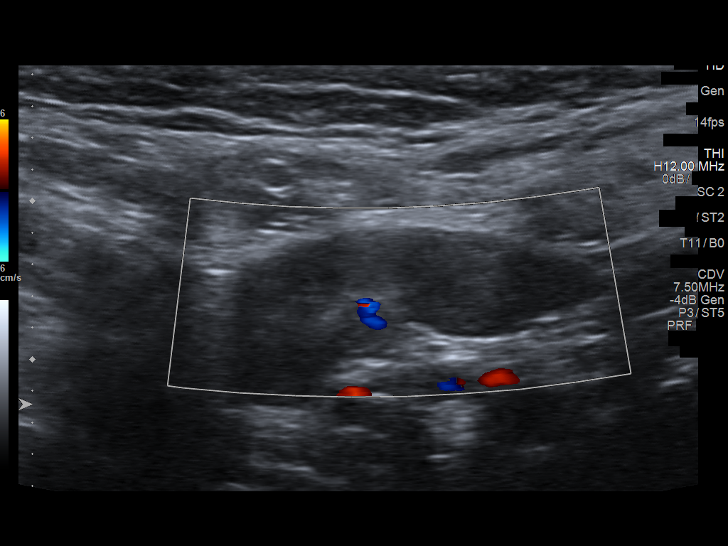

[14 of 25 positions shown; findings below may reference images not displayed]

FINDINGS: Parenchymal Echotexture: Normal

Isthmus: 3 mm

Right lobe: 4.5 x 1.0 x

Left lobe: 3.9 x 0.8 x

_________________________________________________________

Estimated total number of nodules >/= 1 cm: 0

Number of spongiform nodules >/=  2 cm not described below (TR1): 0

Number of mixed cystic and solid nodules >/= 1.5 cm not described
below (TR2): 0

_________________________________________________________

No discrete nodules are seen within the thyroid gland.

Prominent lymph nodes bilaterally by ultrasound with preserved fatty
hila but mildly thickened cortex. These are nonspecific and may be
inflammatory in nature.
IMPRESSION: Normal thyroid ultrasound.

Borderline cervical adenopathy by ultrasound

The above is in keeping with the ACR TI-RADS recommendations - [HOSPITAL] 0358;[DATE].

## 2018-06-09 DIAGNOSIS — T8339XA Other mechanical complication of intrauterine contraceptive device, initial encounter: Secondary | ICD-10-CM | POA: Diagnosis not present

## 2018-06-09 DIAGNOSIS — D259 Leiomyoma of uterus, unspecified: Secondary | ICD-10-CM | POA: Diagnosis not present

## 2018-07-28 ENCOUNTER — Telehealth: Payer: Self-pay | Admitting: Family Medicine

## 2018-07-28 NOTE — Telephone Encounter (Signed)
FYI

## 2018-07-28 NOTE — Telephone Encounter (Signed)
Form completed and placed in basket  

## 2018-07-28 NOTE — Telephone Encounter (Signed)
Paperwork given to PCP.  

## 2018-07-28 NOTE — Telephone Encounter (Signed)
I have placed a physician result form in the bin upfront with a charge sheet.

## 2018-07-29 NOTE — Telephone Encounter (Signed)
Picked up and faxed to # on form.

## 2019-04-30 ENCOUNTER — Encounter: Payer: Self-pay | Admitting: Family Medicine

## 2019-04-30 ENCOUNTER — Ambulatory Visit (INDEPENDENT_AMBULATORY_CARE_PROVIDER_SITE_OTHER): Payer: BC Managed Care – PPO | Admitting: Family Medicine

## 2019-04-30 ENCOUNTER — Other Ambulatory Visit: Payer: Self-pay

## 2019-04-30 VITALS — BP 116/80 | HR 74 | Temp 97.4°F | Resp 16 | Ht 64.0 in | Wt 190.1 lb

## 2019-04-30 DIAGNOSIS — E669 Obesity, unspecified: Secondary | ICD-10-CM | POA: Diagnosis not present

## 2019-04-30 DIAGNOSIS — E559 Vitamin D deficiency, unspecified: Secondary | ICD-10-CM

## 2019-04-30 DIAGNOSIS — Z0000 Encounter for general adult medical examination without abnormal findings: Secondary | ICD-10-CM | POA: Diagnosis not present

## 2019-04-30 LAB — CBC WITH DIFFERENTIAL/PLATELET
Basophils Absolute: 0.1 10*3/uL (ref 0.0–0.1)
Basophils Relative: 1.2 % (ref 0.0–3.0)
Eosinophils Absolute: 0.1 10*3/uL (ref 0.0–0.7)
Eosinophils Relative: 1.1 % (ref 0.0–5.0)
HCT: 39.1 % (ref 36.0–46.0)
Hemoglobin: 12.9 g/dL (ref 12.0–15.0)
Lymphocytes Relative: 35.1 % (ref 12.0–46.0)
Lymphs Abs: 1.9 10*3/uL (ref 0.7–4.0)
MCHC: 33 g/dL (ref 30.0–36.0)
MCV: 90.7 fl (ref 78.0–100.0)
Monocytes Absolute: 0.4 10*3/uL (ref 0.1–1.0)
Monocytes Relative: 7 % (ref 3.0–12.0)
Neutro Abs: 3.1 10*3/uL (ref 1.4–7.7)
Neutrophils Relative %: 55.6 % (ref 43.0–77.0)
Platelets: 202 10*3/uL (ref 150.0–400.0)
RBC: 4.31 Mil/uL (ref 3.87–5.11)
RDW: 13.9 % (ref 11.5–15.5)
WBC: 5.5 10*3/uL (ref 4.0–10.5)

## 2019-04-30 LAB — VITAMIN D 25 HYDROXY (VIT D DEFICIENCY, FRACTURES): VITD: 30.84 ng/mL (ref 30.00–100.00)

## 2019-04-30 LAB — HEPATIC FUNCTION PANEL
ALT: 15 U/L (ref 0–35)
AST: 17 U/L (ref 0–37)
Albumin: 4.5 g/dL (ref 3.5–5.2)
Alkaline Phosphatase: 40 U/L (ref 39–117)
Bilirubin, Direct: 0.2 mg/dL (ref 0.0–0.3)
Total Bilirubin: 1.2 mg/dL (ref 0.2–1.2)
Total Protein: 7.2 g/dL (ref 6.0–8.3)

## 2019-04-30 LAB — LIPID PANEL
Cholesterol: 214 mg/dL — ABNORMAL HIGH (ref 0–200)
HDL: 74.1 mg/dL (ref >39.00)
LDL Cholesterol: 124 mg/dL — ABNORMAL HIGH (ref 0–99)
NonHDL: 139.78
Total CHOL/HDL Ratio: 3
Triglycerides: 78 mg/dL (ref 0.0–149.0)
VLDL: 15.6 mg/dL (ref 0.0–40.0)

## 2019-04-30 LAB — BASIC METABOLIC PANEL
BUN: 15 mg/dL (ref 6–23)
CO2: 26 mEq/L (ref 19–32)
Calcium: 9.6 mg/dL (ref 8.4–10.5)
Chloride: 101 mEq/L (ref 96–112)
Creatinine, Ser: 0.85 mg/dL (ref 0.40–1.20)
GFR: 72.25 mL/min (ref >60.00)
Glucose, Bld: 82 mg/dL (ref 70–99)
Potassium: 4.2 mEq/L (ref 3.5–5.1)
Sodium: 136 mEq/L (ref 135–145)

## 2019-04-30 LAB — TSH: TSH: 1.48 u[IU]/mL (ref 0.35–4.50)

## 2019-04-30 NOTE — Patient Instructions (Signed)
Follow up in 1 year or as needed We'll notify you of your lab results and make any changes if needed Continue to work on healthy diet and regular exercise- you can do it! Call and schedule your mammogram [phone]) We will send your forms in for you once the labs are back Call with any questions or concerns Stay Safe!

## 2019-04-30 NOTE — Assessment & Plan Note (Signed)
Pt has hx of this.  Check labs and replete prn. 

## 2019-04-30 NOTE — Assessment & Plan Note (Signed)
Pt's PE WNL w/ exception of obesity.  UTD on Tdap, pap.  Plans to schedule mammo.  Check labs.  Anticipatory guidance provided.

## 2019-04-30 NOTE — Assessment & Plan Note (Signed)
Deteriorated.  Pt has gained 8 lbs during [place].  She reports she is back on track w/ diet and exercise.  Applauded her efforts.  Check labs to risk stratify.  Will follow.

## 2019-04-30 NOTE — Progress Notes (Signed)
   Subjective:    Patient ID: Felicia Gutierrez, female    DOB: 03/23/1976, 43 y.o.   MRN: [id number]  HPI CPE- UTD on pap.  Due for mammo.  UTD on Tdap.  Pt has gained 8 lbs since last visit.  No concerns today.   Review of Systems Patient reports no vision/ hearing changes, adenopathy,fever, weight change,  persistant/recurrent hoarseness , swallowing issues, chest pain, palpitations, edema, persistant/recurrent cough, hemoptysis, dyspnea (rest/exertional/paroxysmal nocturnal), gastrointestinal bleeding (melena, rectal bleeding), abdominal pain, significant heartburn, bowel changes, GU symptoms (dysuria, hematuria, incontinence), Gyn symptoms (abnormal  bleeding, pain),  syncope, focal weakness, memory loss, numbness & tingling, skin/hair/nail changes, abnormal bruising or bleeding, anxiety, or depression.     Objective:   Physical Exam General Appearance:    Alert, cooperative, no distress, appears stated age, obese  Head:    Normocephalic, without obvious abnormality, atraumatic  Eyes:    PERRL, conjunctiva/corneas clear, EOM's intact, fundi    benign, both eyes  Ears:    Normal TM's and external ear canals, both ears  Nose:   Deferred due to COVID  Throat:   Neck:   Supple, symmetrical, trachea midline, no adenopathy;    Thyroid: no enlargement/tenderness/nodules  Back:     Symmetric, no curvature, ROM normal, no CVA tenderness  Lungs:     Clear to auscultation bilaterally, respirations unlabored  Chest Wall:    No tenderness or deformity   Heart:    Regular rate and rhythm, S1 and S2 normal, no murmur, rub   or gallop  Breast Exam:    Deferred to GYN  Abdomen:     Soft, non-tender, bowel sounds active all four quadrants,    no masses, no organomegaly  Genitalia:    Deferred to GYN  Rectal:    Extremities:   Extremities normal, atraumatic, no cyanosis or edema  Pulses:   2+ and symmetric all extremities  Skin:   Skin color, texture, turgor normal, no rashes or lesions   Lymph nodes:   Cervical, supraclavicular, and axillary nodes normal  Neurologic:   CNII-XII intact, normal strength, sensation and reflexes    throughout          Assessment & Plan:

## 2019-06-12 DIAGNOSIS — Z3202 Encounter for pregnancy test, result negative: Secondary | ICD-10-CM | POA: Diagnosis not present

## 2019-06-12 DIAGNOSIS — Z30430 Encounter for insertion of intrauterine contraceptive device: Secondary | ICD-10-CM | POA: Diagnosis not present

## 2019-06-21 IMAGING — MG DIGITAL SCREENING BILATERAL MAMMOGRAM WITH TOMO AND CAD
8 series · 8 of 24 positions shown · non-contrast
Comparison: Previous exam(s).

CLINICAL DATA: Screening.

EXAM:
DIGITAL SCREENING BILATERAL MAMMOGRAM WITH TOMO AND CAD

[R CC synth-2D]
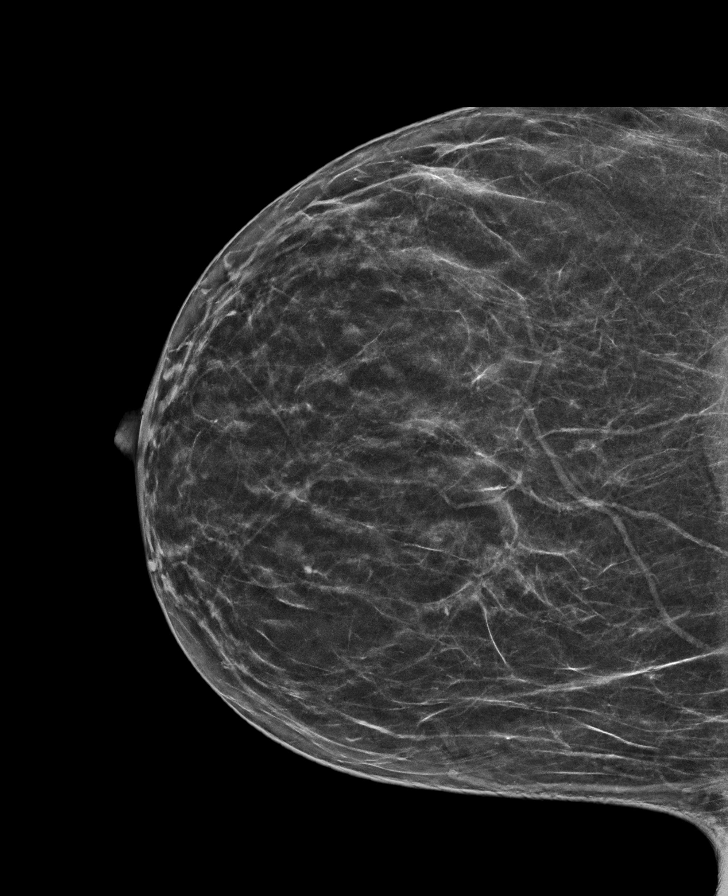

[L CC synth-2D]
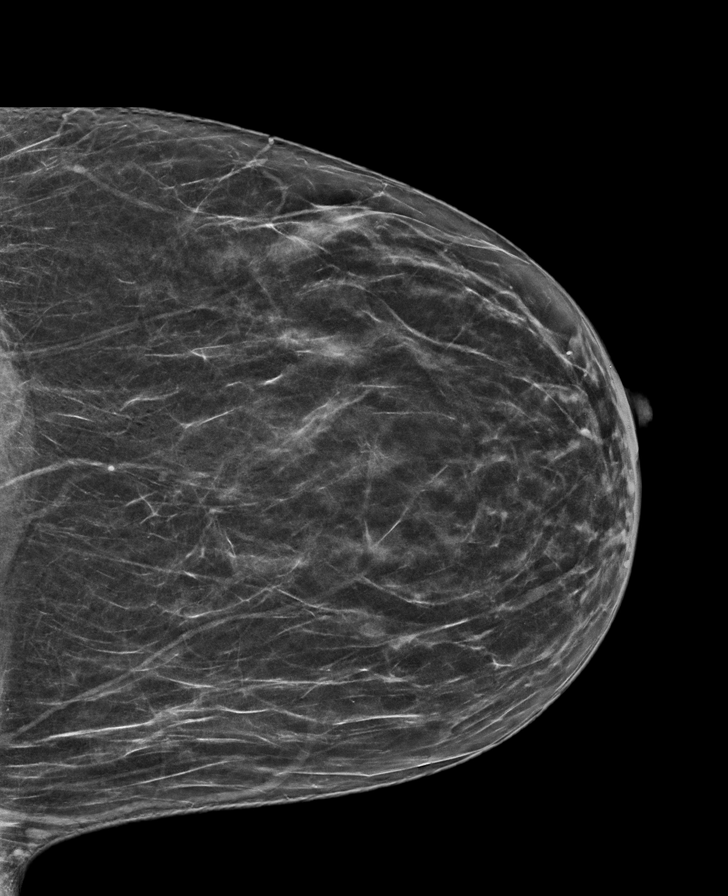

[R MLO synth-2D]
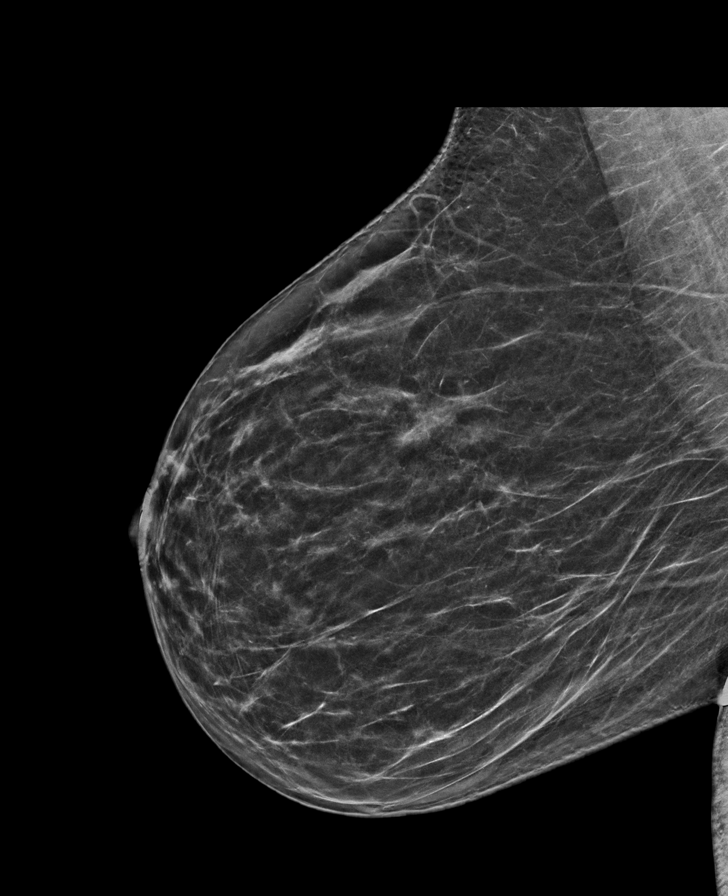

[L MLO synth-2D]
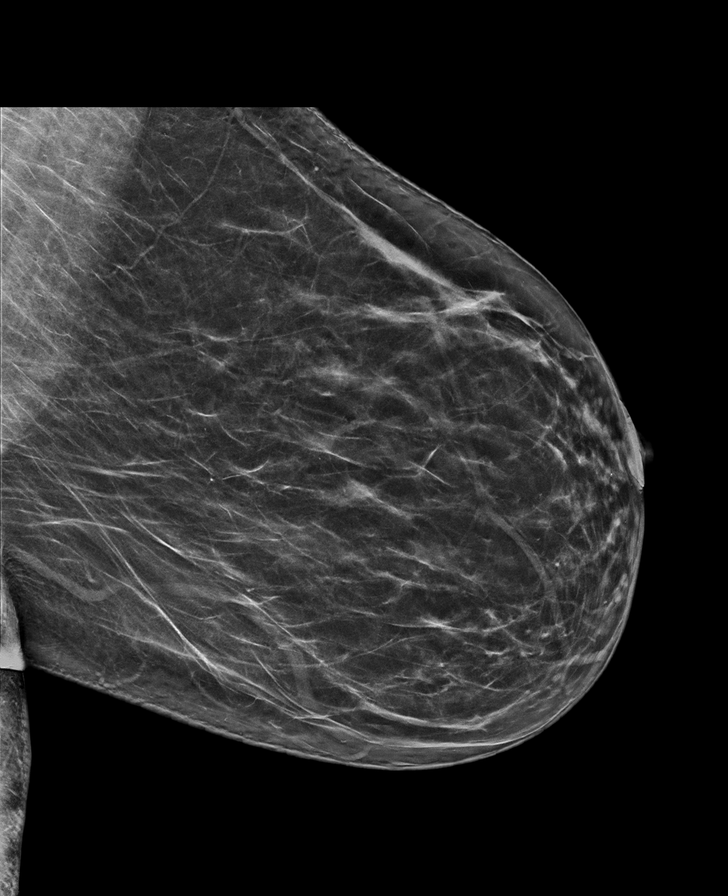

[R MLO tomo · tomo slice 34/67.0]
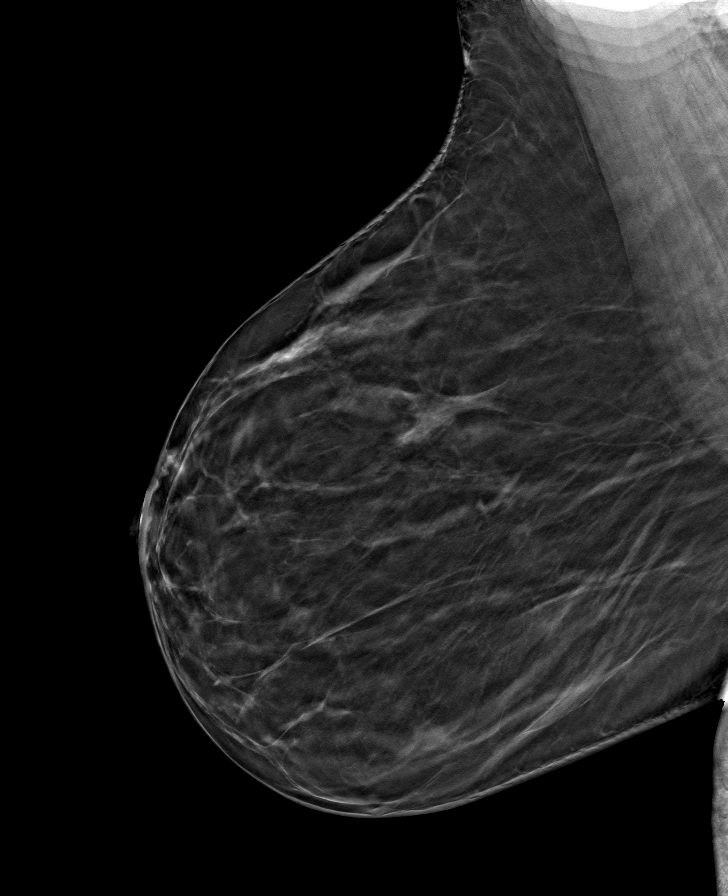

[L CC tomo · tomo slice 33/65.0]
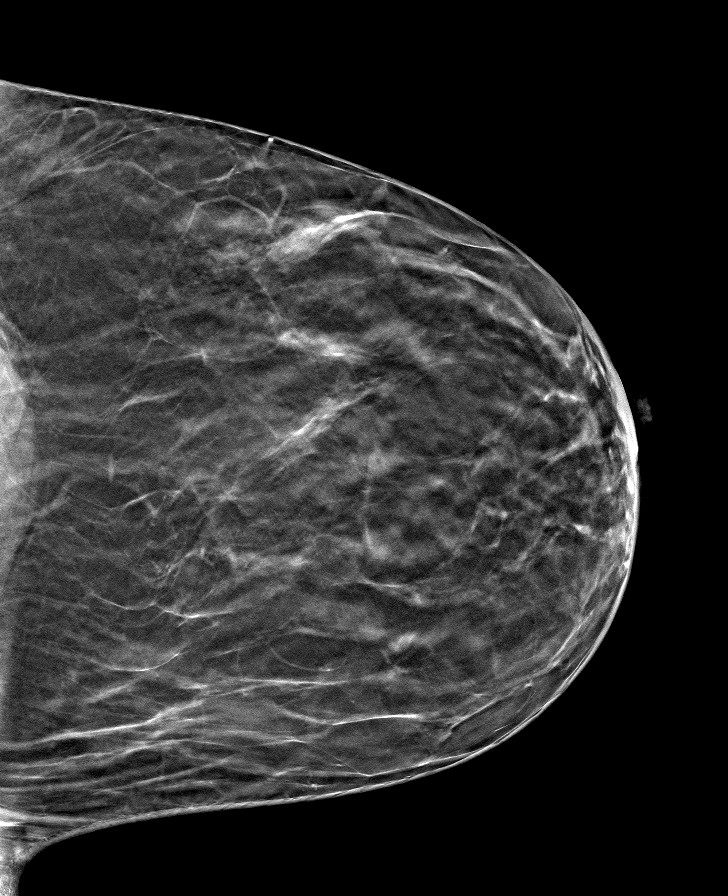

[R CC tomo · tomo slice 31/60.0]
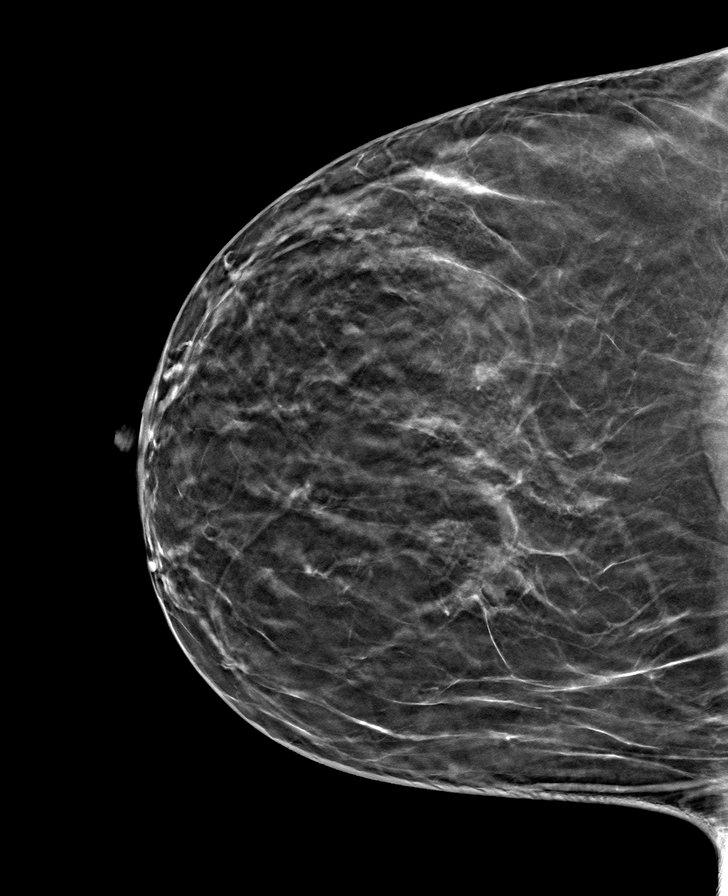

[L MLO tomo · tomo slice 37/72.0]
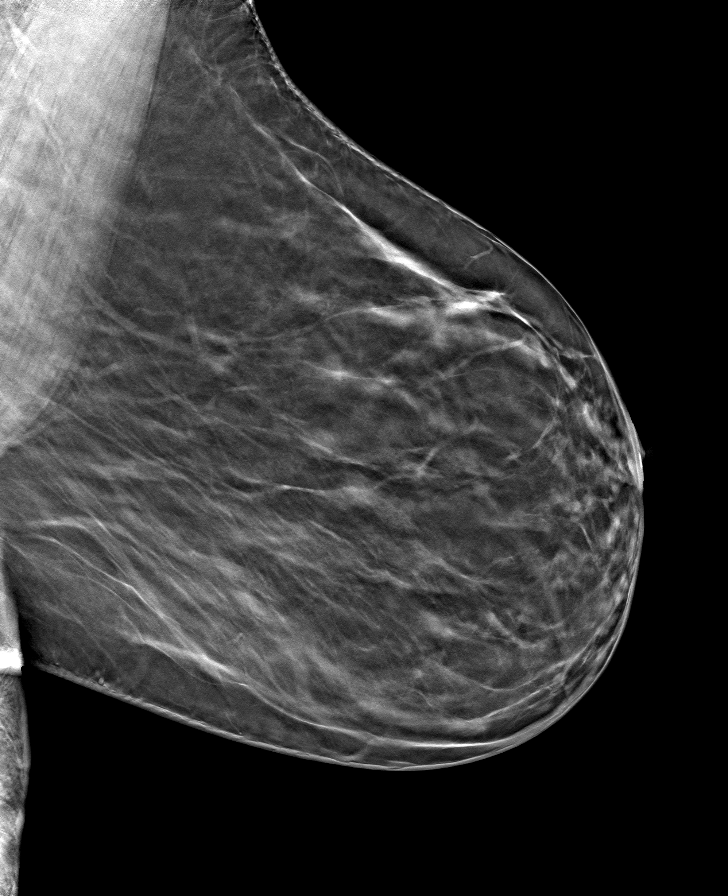

[8 of 24 positions shown; findings below may reference images not displayed]

ACR Breast Density Category b: There are scattered areas of
fibroglandular density.
FINDINGS: There are no findings suspicious for malignancy. Images were
processed with CAD.
IMPRESSION: No mammographic evidence of malignancy. A result letter of this
screening mammogram will be mailed directly to the patient.

RECOMMENDATION:
Screening mammogram in one year. (Code:[id number])

BI-RADS CATEGORY  1: Negative.

## 2019-11-20 DIAGNOSIS — R079 Chest pain, unspecified: Secondary | ICD-10-CM | POA: Diagnosis not present

## 2019-11-20 DIAGNOSIS — R0789 Other chest pain: Secondary | ICD-10-CM | POA: Diagnosis not present

## 2019-11-20 DIAGNOSIS — R7989 Other specified abnormal findings of blood chemistry: Secondary | ICD-10-CM | POA: Diagnosis not present

## 2019-11-23 DIAGNOSIS — R079 Chest pain, unspecified: Secondary | ICD-10-CM | POA: Diagnosis not present

## 2020-03-21 ENCOUNTER — Telehealth: Payer: Self-pay | Admitting: Family Medicine

## 2020-03-21 NOTE — Telephone Encounter (Signed)
I have placed a cpe form in the bin up front with a charge sheet.

## 2020-03-22 NOTE — Telephone Encounter (Signed)
FYI

## 2020-03-22 NOTE — Telephone Encounter (Signed)
Pt is aware of this.  °

## 2020-03-22 NOTE — Telephone Encounter (Signed)
I would prefer to wait for updated CPE as form is not due til December

## 2020-03-22 NOTE — Telephone Encounter (Signed)
Please advise, Last CPE was 04/2019. Pt has a CPE 05/2020. Form does not have to be completed until 09/30/2020. Do we want to use old CPE or wait for new appt?

## 2020-05-02 ENCOUNTER — Other Ambulatory Visit: Payer: Self-pay

## 2020-05-02 ENCOUNTER — Encounter: Payer: Self-pay | Admitting: Family Medicine

## 2020-05-02 ENCOUNTER — Ambulatory Visit (INDEPENDENT_AMBULATORY_CARE_PROVIDER_SITE_OTHER): Payer: BC Managed Care – PPO | Admitting: Family Medicine

## 2020-05-02 VITALS — BP 124/82 | HR 86 | Temp 97.4°F | Resp 16 | Ht 64.0 in | Wt 188.1 lb

## 2020-05-02 DIAGNOSIS — E559 Vitamin D deficiency, unspecified: Secondary | ICD-10-CM | POA: Diagnosis not present

## 2020-05-02 DIAGNOSIS — Z1211 Encounter for screening for malignant neoplasm of colon: Secondary | ICD-10-CM | POA: Diagnosis not present

## 2020-05-02 DIAGNOSIS — E669 Obesity, unspecified: Secondary | ICD-10-CM

## 2020-05-02 DIAGNOSIS — Z0000 Encounter for general adult medical examination without abnormal findings: Secondary | ICD-10-CM

## 2020-05-02 LAB — LIPID PANEL
Cholesterol: 203 mg/dL — ABNORMAL HIGH (ref 0–200)
HDL: 66.4 mg/dL (ref >39.00)
LDL Cholesterol: 127 mg/dL — ABNORMAL HIGH (ref 0–99)
NonHDL: 136.6
Total CHOL/HDL Ratio: 3
Triglycerides: 46 mg/dL (ref 0.0–149.0)
VLDL: 9.2 mg/dL (ref 0.0–40.0)

## 2020-05-02 LAB — CBC WITH DIFFERENTIAL/PLATELET
Basophils Absolute: 0 10*3/uL (ref 0.0–0.1)
Basophils Relative: 0.2 % (ref 0.0–3.0)
Eosinophils Absolute: 0 10*3/uL (ref 0.0–0.7)
Eosinophils Relative: 1 % (ref 0.0–5.0)
HCT: 36.6 % (ref 36.0–46.0)
Hemoglobin: 12.1 g/dL (ref 12.0–15.0)
Lymphocytes Relative: 39.7 % (ref 12.0–46.0)
Lymphs Abs: 1.9 10*3/uL (ref 0.7–4.0)
MCHC: 33 g/dL (ref 30.0–36.0)
MCV: 90.8 fl (ref 78.0–100.0)
Monocytes Absolute: 0.4 10*3/uL (ref 0.1–1.0)
Monocytes Relative: 7.3 % (ref 3.0–12.0)
Neutro Abs: 2.5 10*3/uL (ref 1.4–7.7)
Neutrophils Relative %: 51.8 % (ref 43.0–77.0)
Platelets: 159 10*3/uL (ref 150.0–400.0)
RBC: 4.03 Mil/uL (ref 3.87–5.11)
RDW: 13.9 % (ref 11.5–15.5)
WBC: 4.9 10*3/uL (ref 4.0–10.5)

## 2020-05-02 LAB — HEPATIC FUNCTION PANEL
ALT: 11 U/L (ref 0–35)
AST: 13 U/L (ref 0–37)
Albumin: 4.2 g/dL (ref 3.5–5.2)
Alkaline Phosphatase: 35 U/L — ABNORMAL LOW (ref 39–117)
Bilirubin, Direct: 0.1 mg/dL (ref 0.0–0.3)
Total Bilirubin: 0.6 mg/dL (ref 0.2–1.2)
Total Protein: 6.9 g/dL (ref 6.0–8.3)

## 2020-05-02 LAB — BASIC METABOLIC PANEL
BUN: 12 mg/dL (ref 6–23)
CO2: 23 mEq/L (ref 19–32)
Calcium: 9.2 mg/dL (ref 8.4–10.5)
Chloride: 106 mEq/L (ref 96–112)
Creatinine, Ser: 0.79 mg/dL (ref 0.40–1.20)
GFR: 78.27 mL/min (ref >60.00)
Glucose, Bld: 93 mg/dL (ref 70–99)
Potassium: 4.4 mEq/L (ref 3.5–5.1)
Sodium: 135 mEq/L (ref 135–145)

## 2020-05-02 LAB — TSH: TSH: 2.71 u[IU]/mL (ref 0.35–4.50)

## 2020-05-02 LAB — VITAMIN D 25 HYDROXY (VIT D DEFICIENCY, FRACTURES): VITD: 21.1 ng/mL — ABNORMAL LOW (ref 30.00–100.00)

## 2020-05-02 NOTE — Progress Notes (Signed)
   Subjective:    Patient ID: Felicia Gutierrez, female    DOB: 13-Mar-1976, 44 y.o.   MRN: [id number]  HPI CPE- Due for pap, mammo, colon cancer screen.  UTD on Tdap, COVID vaccines.  Reviewed past medical, surgical, family and social histories.   Patient Care Team    Relationship Specialty Notifications Start End  Midge Minium, MD PCP - General Family Medicine  01/07/15   Doroteo Glassman, MD Referring Physician Obstetrics and Gynecology  03/25/15       Review of Systems Patient reports no vision/ hearing changes, adenopathy,fever, weight change,  persistant/recurrent hoarseness , swallowing issues, chest pain, palpitations, edema, persistant/recurrent cough, hemoptysis, dyspnea (rest/exertional/paroxysmal nocturnal), gastrointestinal bleeding (melena, rectal bleeding), abdominal pain, significant heartburn, bowel changes, GU symptoms (dysuria, hematuria, incontinence), Gyn symptoms (abnormal  bleeding, pain),  syncope, focal weakness, memory loss, numbness & tingling, skin/hair/nail changes, abnormal bruising or bleeding, anxiety, or depression.   This visit occurred during the SARS-CoV-2 public health emergency.  Safety protocols were in place, including screening questions prior to the visit, additional usage of staff PPE, and extensive cleaning of exam room while observing appropriate contact time as indicated for disinfecting solutions.       Objective:   Physical Exam General Appearance:    Alert, cooperative, no distress, appears stated age, obese  Head:    Normocephalic, without obvious abnormality, atraumatic  Eyes:    PERRL, conjunctiva/corneas clear, EOM's intact, fundi    benign, both eyes  Ears:    Normal TM's and external ear canals, both ears  Nose:   Deferred due to COVID  Throat:   Neck:   Supple, symmetrical, trachea midline, no adenopathy;    Thyroid: no enlargement/tenderness/nodules  Back:     Symmetric, no curvature, ROM normal, no CVA tenderness  Lungs:      Clear to auscultation bilaterally, respirations unlabored  Chest Wall:    No tenderness or deformity   Heart:    Regular rate and rhythm, S1 and S2 normal, no murmur, rub   or gallop  Breast Exam:    Deferred to GYN  Abdomen:     Soft, non-tender, bowel sounds active all four quadrants,    no masses, no organomegaly  Genitalia:    Deferred to GYN  Rectal:    Extremities:   Extremities normal, atraumatic, no cyanosis or edema  Pulses:   2+ and symmetric all extremities  Skin:   Skin color, texture, turgor normal, no rashes or lesions  Lymph nodes:   Cervical, supraclavicular, and axillary nodes normal  Neurologic:   CNII-XII intact, normal strength, sensation and reflexes    throughout          Assessment & Plan:

## 2020-05-02 NOTE — Patient Instructions (Signed)
Follow up in 1 year or as needed We'll notify you of your lab results and make any changes if needed Continue to work on healthy diet and regular exercise- you can do it! Call and schedule your pap and mammo w/ GYN We'll call you with your GI appt for colon cancer screen Call with any questions or concerns Stay Safe!  Stay Healthy!  Enjoy Vacation!!!!

## 2020-05-02 NOTE — Assessment & Plan Note (Signed)
Pt has hx of this.  Check labs and replete prn. 

## 2020-05-02 NOTE — Assessment & Plan Note (Signed)
Pt's PE WNL w/ exception of obesity.  UTD immunizations.  Due for pap and mammo- pt to call GYN and schedule.  Referral placed for colon cancer screen.  Check labs.  Anticipatory guidance provided.

## 2020-05-02 NOTE — Assessment & Plan Note (Signed)
Ongoing issue for pt.  Stressed need for healthy diet and regular exercise.  Check labs to risk stratify.  Will follow 

## 2020-05-03 ENCOUNTER — Other Ambulatory Visit: Payer: Self-pay | Admitting: Family Medicine

## 2020-05-03 MED ORDER — VITAMIN D (ERGOCALCIFEROL) 1.25 MG (50000 UNIT) PO CAPS: 50000.0000 [IU] | ORAL_CAPSULE | ORAL | 0 refills | Status: DC

## 2020-07-19 ENCOUNTER — Other Ambulatory Visit: Payer: Self-pay | Admitting: Family Medicine

## 2020-07-29 DIAGNOSIS — Z1211 Encounter for screening for malignant neoplasm of colon: Secondary | ICD-10-CM | POA: Diagnosis not present

## 2020-10-04 DIAGNOSIS — Z20822 Contact with and (suspected) exposure to covid-19: Secondary | ICD-10-CM | POA: Diagnosis not present

## 2021-03-01 DIAGNOSIS — Z1231 Encounter for screening mammogram for malignant neoplasm of breast: Secondary | ICD-10-CM | POA: Diagnosis not present

## 2021-03-01 DIAGNOSIS — Z6831 Body mass index (BMI) 31.0-31.9, adult: Secondary | ICD-10-CM | POA: Diagnosis not present

## 2021-03-01 DIAGNOSIS — Z01419 Encounter for gynecological examination (general) (routine) without abnormal findings: Secondary | ICD-10-CM | POA: Diagnosis not present

## 2021-03-03 DIAGNOSIS — Z01419 Encounter for gynecological examination (general) (routine) without abnormal findings: Secondary | ICD-10-CM | POA: Diagnosis not present

## 2021-03-29 ENCOUNTER — Encounter: Payer: Self-pay | Admitting: *Deleted

## 2021-04-04 DIAGNOSIS — R922 Inconclusive mammogram: Secondary | ICD-10-CM | POA: Diagnosis not present

## 2021-04-04 DIAGNOSIS — N6001 Solitary cyst of right breast: Secondary | ICD-10-CM | POA: Diagnosis not present

## 2021-04-04 DIAGNOSIS — N6489 Other specified disorders of breast: Secondary | ICD-10-CM | POA: Diagnosis not present

## 2021-05-04 ENCOUNTER — Encounter: Payer: BC Managed Care – PPO | Admitting: Family Medicine

## 2021-06-08 ENCOUNTER — Encounter: Payer: BC Managed Care – PPO | Admitting: Family Medicine

## 2021-09-04 ENCOUNTER — Encounter: Payer: Self-pay | Admitting: Family Medicine

## 2021-09-04 ENCOUNTER — Ambulatory Visit (INDEPENDENT_AMBULATORY_CARE_PROVIDER_SITE_OTHER): Payer: BC Managed Care – PPO | Admitting: Family Medicine

## 2021-09-04 VITALS — BP 110/78 | HR 85 | Temp 97.7°F | Resp 16 | Ht 64.0 in | Wt 192.8 lb

## 2021-09-04 DIAGNOSIS — E669 Obesity, unspecified: Secondary | ICD-10-CM

## 2021-09-04 DIAGNOSIS — Z23 Encounter for immunization: Secondary | ICD-10-CM | POA: Diagnosis not present

## 2021-09-04 DIAGNOSIS — Z0000 Encounter for general adult medical examination without abnormal findings: Secondary | ICD-10-CM

## 2021-09-04 DIAGNOSIS — E559 Vitamin D deficiency, unspecified: Secondary | ICD-10-CM

## 2021-09-04 LAB — CBC WITH DIFFERENTIAL/PLATELET
Basophils Absolute: 0 10*3/uL (ref 0.0–0.1)
Basophils Relative: 0.9 % (ref 0.0–3.0)
Eosinophils Absolute: 0 10*3/uL (ref 0.0–0.7)
Eosinophils Relative: 0.8 % (ref 0.0–5.0)
HCT: 38.1 % (ref 36.0–46.0)
Hemoglobin: 12.2 g/dL (ref 12.0–15.0)
Lymphocytes Relative: 40.4 % (ref 12.0–46.0)
Lymphs Abs: 1.8 10*3/uL (ref 0.7–4.0)
MCHC: 32 g/dL (ref 30.0–36.0)
MCV: 91.7 fl (ref 78.0–100.0)
Monocytes Absolute: 0.3 10*3/uL (ref 0.1–1.0)
Monocytes Relative: 6.5 % (ref 3.0–12.0)
Neutro Abs: 2.2 10*3/uL (ref 1.4–7.7)
Neutrophils Relative %: 51.4 % (ref 43.0–77.0)
Platelets: 203 10*3/uL (ref 150.0–400.0)
RBC: 4.15 Mil/uL (ref 3.87–5.11)
RDW: 14.1 % (ref 11.5–15.5)
WBC: 4.4 10*3/uL (ref 4.0–10.5)

## 2021-09-04 LAB — LIPID PANEL
Cholesterol: 212 mg/dL — ABNORMAL HIGH (ref 0–200)
HDL: 79.7 mg/dL (ref >39.00)
LDL Cholesterol: 121 mg/dL — ABNORMAL HIGH (ref 0–99)
NonHDL: 132.56
Total CHOL/HDL Ratio: 3
Triglycerides: 58 mg/dL (ref 0.0–149.0)
VLDL: 11.6 mg/dL (ref 0.0–40.0)

## 2021-09-04 LAB — VITAMIN D 25 HYDROXY (VIT D DEFICIENCY, FRACTURES): VITD: 27.52 ng/mL — ABNORMAL LOW (ref 30.00–100.00)

## 2021-09-04 LAB — BASIC METABOLIC PANEL
BUN: 14 mg/dL (ref 6–23)
CO2: 27 mEq/L (ref 19–32)
Calcium: 9.2 mg/dL (ref 8.4–10.5)
Chloride: 103 mEq/L (ref 96–112)
Creatinine, Ser: 0.8 mg/dL (ref 0.40–1.20)
GFR: 87.64 mL/min (ref >60.00)
Glucose, Bld: 84 mg/dL (ref 70–99)
Potassium: 4.3 mEq/L (ref 3.5–5.1)
Sodium: 138 mEq/L (ref 135–145)

## 2021-09-04 LAB — HEPATIC FUNCTION PANEL
ALT: 26 U/L (ref 0–35)
AST: 20 U/L (ref 0–37)
Albumin: 4.3 g/dL (ref 3.5–5.2)
Alkaline Phosphatase: 37 U/L — ABNORMAL LOW (ref 39–117)
Bilirubin, Direct: 0.1 mg/dL (ref 0.0–0.3)
Total Bilirubin: 0.9 mg/dL (ref 0.2–1.2)
Total Protein: 7.1 g/dL (ref 6.0–8.3)

## 2021-09-04 LAB — TSH: TSH: 2.42 u[IU]/mL (ref 0.35–5.50)

## 2021-09-04 NOTE — Assessment & Plan Note (Signed)
Ongoing issue for pt.  BMI 33.09.  Encouraged healthy diet and regular exercise- check labs to risk stratify.  Will follow.

## 2021-09-04 NOTE — Patient Instructions (Signed)
Follow up in 1 year or as needed We'll notify you of your lab results and make any changes if needed Continue to work on healthy diet and regular exercise- you can do it! Call with any questions or concerns Stay Safe!  Stay Healthy! Happy Holidays!!! 

## 2021-09-04 NOTE — Progress Notes (Signed)
   Subjective:    Patient ID: Felicia Gutierrez, female    DOB: 10-Apr-1976, 45 y.o.   MRN: [id number]  HPI CPE- due for flu shot.  UTD on mammo, pap, Tdap.  UTD on colonoscopy  Patient Care Team    Relationship Specialty Notifications Start End  Midge Minium, MD PCP - General Family Medicine  01/07/15   Doroteo Glassman, MD Referring Physician Obstetrics and Gynecology  03/25/15     Health Maintenance  Topic Date Due   Hepatitis C Screening  Never done   COLONOSCOPY (Pts 45-81yrs Insurance coverage will need to be confirmed)  Never done   COVID-19 Vaccine (3 - Booster) 02/14/2020   INFLUENZA VACCINE  Never done   MAMMOGRAM  04/04/2022   PAP SMEAR-Modifier  04/29/2023   TETANUS/TDAP  03/24/2025   HIV Screening  Completed   Pneumococcal Vaccine 27-51 Years old  Aged Out   HPV VACCINES  Aged Out      Review of Systems Patient reports no vision/ hearing changes, adenopathy,fever, weight change,  persistant/recurrent hoarseness , swallowing issues, chest pain, palpitations, edema, persistant/recurrent cough, hemoptysis, dyspnea (rest/exertional/paroxysmal nocturnal), gastrointestinal bleeding (melena, rectal bleeding), abdominal pain, significant heartburn, bowel changes, GU symptoms (dysuria, hematuria, incontinence), Gyn symptoms (abnormal  bleeding, pain),  syncope, focal weakness, memory loss, numbness & tingling, skin/nail changes, abnormal bruising or bleeding, anxiety, or depression.   + hair loss- started Rogaine in addition to Zinc, Biotin, Vit D  This visit occurred during the SARS-CoV-2 public health emergency.  Safety protocols were in place, including screening questions prior to the visit, additional usage of staff PPE, and extensive cleaning of exam room while observing appropriate contact time as indicated for disinfecting solutions.      Objective:   Physical Exam Vitals reviewed.  Constitutional:      General: She is not in acute distress.    Appearance: She  is well-developed. She is obese. She is not ill-appearing.  HENT:     Head: Normocephalic and atraumatic.  Eyes:     Conjunctiva/sclera: Conjunctivae normal.     Pupils: Pupils are equal, round, and reactive to light.  Neck:     Thyroid: No thyromegaly.  Cardiovascular:     Rate and Rhythm: Normal rate and regular rhythm.     Pulses: Normal pulses.     Heart sounds: Normal heart sounds. No murmur heard. Pulmonary:     Effort: Pulmonary effort is normal. No respiratory distress.     Breath sounds: Normal breath sounds.  Abdominal:     General: There is no distension.     Palpations: Abdomen is soft.     Tenderness: There is no abdominal tenderness.  Musculoskeletal:     Cervical back: Normal range of motion and neck supple.     Right lower leg: No edema.     Left lower leg: No edema.  Lymphadenopathy:     Cervical: No cervical adenopathy.  Skin:    General: Skin is warm and dry.  Neurological:     Mental Status: She is alert and oriented to person, place, and time.  Psychiatric:        Behavior: Behavior normal.          Assessment & Plan:

## 2021-09-04 NOTE — Assessment & Plan Note (Signed)
Pt has hx of this.  Check labs and replete prn. 

## 2021-09-04 NOTE — Assessment & Plan Note (Signed)
Pt's PE WNL w/ exception of obesity.  UTD on pap, mammo, colonoscopy, Tdap.  Flu shot given today.  Check labs.  Anticipatory guidance provided.

## 2021-09-05 ENCOUNTER — Other Ambulatory Visit: Payer: Self-pay

## 2021-09-05 MED ORDER — VITAMIN D (ERGOCALCIFEROL) 1.25 MG (50000 UNIT) PO CAPS: 50000.0000 [IU] | ORAL_CAPSULE | ORAL | 0 refills | Status: DC

## 2021-10-06 DIAGNOSIS — N6489 Other specified disorders of breast: Secondary | ICD-10-CM | POA: Diagnosis not present

## 2021-10-06 DIAGNOSIS — N6001 Solitary cyst of right breast: Secondary | ICD-10-CM | POA: Diagnosis not present

## 2021-10-06 DIAGNOSIS — R922 Inconclusive mammogram: Secondary | ICD-10-CM | POA: Diagnosis not present

## 2021-10-18 ENCOUNTER — Telehealth: Payer: Self-pay

## 2021-10-18 ENCOUNTER — Telehealth (INDEPENDENT_AMBULATORY_CARE_PROVIDER_SITE_OTHER): Payer: BC Managed Care – PPO | Admitting: Registered Nurse

## 2021-10-18 DIAGNOSIS — Z20822 Contact with and (suspected) exposure to covid-19: Secondary | ICD-10-CM | POA: Diagnosis not present

## 2021-10-18 DIAGNOSIS — U071 COVID-19: Secondary | ICD-10-CM | POA: Diagnosis not present

## 2021-10-18 MED ORDER — PREDNISONE 10 MG (21) PO TBPK: ORAL_TABLET | ORAL | 0 refills | Status: DC

## 2021-10-18 MED ORDER — AZELASTINE HCL 0.1 % NA SOLN: 1.0000 | Freq: Two times a day (BID) | NASAL | 12 refills | Status: DC

## 2021-10-18 MED ORDER — FLUTICASONE PROPIONATE 50 MCG/ACT NA SUSP: 2.0000 | Freq: Every day | NASAL | 6 refills | Status: DC

## 2021-10-18 MED ORDER — BENZONATATE 200 MG PO CAPS
200.0000 mg | ORAL_CAPSULE | Freq: Three times a day (TID) | ORAL | 0 refills | Status: DC | PRN
Start: 2021-10-18 — End: 2022-09-05

## 2021-10-18 NOTE — Progress Notes (Signed)
Telemedicine Encounter- SOAP NOTE Established Patient  This video encounter was conducted with the patient's (or proxy's) verbal consent via audio telecommunications: yes/no: Yes Patient was instructed to have this encounter in a suitably private space; and to only have persons present to whom they give permission to participate. In addition, patient identity was confirmed by use of name plus two identifiers (DOB and address).  I discussed the limitations, risks, security and privacy concerns of performing an evaluation and management service by telephone and the availability of in person appointments. I also discussed with the patient that there may be a patient responsible charge related to this service. The patient expressed understanding and agreed to proceed.  I spent a total of 14 minutes talking with the patient or their proxy.  Patient at home Provider in office  Participants: Kathrin Ruddy, NP and Nira Conn  Chief Complaint  Patient presents with   Covid Positive    Subjective   Felicia Gutierrez is a 46 y.o. established patient. Video visit today for  HPI COVID+ Symptoms started on Saturday during the day. Started with headache. Congestion, ear pressure, feverish, chills, sneezing, sore throat, has had some nausea and diarrhea, some lightheadedness, dizziness ("equilibrium thrown off").  Positive test yesterday at home.   Denies cough, shob, doe, chest pain, vomiting.  Using dayquil and nyquil with some relief - helps her to sleep at night.    Patient Active Problem List   Diagnosis Date Noted   Vitamin D deficiency 04/26/2017   Obesity (BMI 30-39.9) 04/26/2017   Physical exam 03/27/2015    Past Medical History:  Diagnosis Date   Fibroid uterus    History of chicken pox    Hx of migraines     Current Outpatient Medications  Medication Sig Dispense Refill   azelastine (ASTELIN) 0.1 % nasal spray Place 1 spray into both nostrils 2  (two) times daily. Use in each nostril as directed 30 mL 12   benzonatate (TESSALON) 200 MG capsule Take 1 capsule (200 mg total) by mouth 3 (three) times daily as needed for cough. 20 capsule 0   BIOTIN 5000 PO Take by mouth.     fluticasone (FLONASE) 50 MCG/ACT nasal spray Place 2 sprays into both nostrils daily. 16 g 6   Minoxidil (ROGAINE WOMENS EX) Apply topically.     Multiple Vitamins-Minerals (ZINC PO) Take by mouth.     predniSONE (STERAPRED UNI-PAK 21 TAB) 10 MG (21) TBPK tablet Take per package instructions. Do not skip doses. Finish entire supply. 1 each 0   Vitamin D, Ergocalciferol, (DRISDOL) 1.25 MG (50000 UNIT) CAPS capsule Take 1 capsule (50,000 Units total) by mouth every 7 (seven) days. 12 capsule 0   Multiple Vitamin (MULTIVITAMIN) tablet Take 1 tablet by mouth daily. (Patient not taking: Reported on 09/04/2021)     No current facility-administered medications for this visit.    No Known Allergies  Social History   Socioeconomic History   Marital status: Single    Spouse name: Not on file   Number of children: Not on file   Years of education: Not on file   Highest education level: Not on file  Occupational History   Not on file  Tobacco Use   Smoking status: Never   Smokeless tobacco: Never  Vaping Use   Vaping Use: Never used  Substance and Sexual Activity   Alcohol use: Yes   Drug use: No   Sexual activity: Yes  Other Topics Concern   Not  on file  Social History Narrative   Not on file   Social Determinants of Health   Financial Resource Strain: Not on file  Food Insecurity: Not on file  Transportation Needs: Not on file  Physical Activity: Not on file  Stress: Not on file  Social Connections: Not on file  Intimate Partner Violence: Not on file    ROS Per hpi   Objective   Vitals as reported by the patient: There were no vitals filed for this visit.  Felicia Gutierrez was seen today for covid positive.  Diagnoses and all orders for this  visit:  COVID-19 -     predniSONE (STERAPRED UNI-PAK 21 TAB) 10 MG (21) TBPK tablet; Take per package instructions. Do not skip doses. Finish entire supply. -     azelastine (ASTELIN) 0.1 % nasal spray; Place 1 spray into both nostrils 2 (two) times daily. Use in each nostril as directed -     fluticasone (FLONASE) 50 MCG/ACT nasal spray; Place 2 sprays into both nostrils daily. -     benzonatate (TESSALON) 200 MG capsule; Take 1 capsule (200 mg total) by mouth 3 (three) times daily as needed for cough.    PLAN Reviewed risks, benefits, alternatives to antivirals. Opt against Will pursue prednisone taper and supportive care as above Reviewed isolation and return to work guidelines. Work note provided via Pharmacist, community.  ER precautions reviewed Patient encouraged to call clinic with any questions, comments, or concerns.  I discussed the assessment and treatment plan with the patient. The patient was provided an opportunity to ask questions and all were answered. The patient agreed with the plan and demonstrated an understanding of the instructions.   The patient was advised to call back or seek an in-person evaluation if the symptoms worsen or if the condition fails to improve as anticipated.  I provided 14 minutes of face-to-face time during this encounter.  Maximiano Coss, NP

## 2021-10-18 NOTE — Telephone Encounter (Signed)
Caller name:Yanelli Ward Chatters   On DPR? :Yes  Call back [facility]  Provider they see: Birdie Riddle   Reason for call:Tested positive for Covid FYI

## 2021-10-18 NOTE — Telephone Encounter (Signed)
FYI Pt positive for COVID

## 2022-04-25 DIAGNOSIS — Z01419 Encounter for gynecological examination (general) (routine) without abnormal findings: Secondary | ICD-10-CM | POA: Diagnosis not present

## 2022-04-25 LAB — HM PAP SMEAR

## 2022-06-12 DIAGNOSIS — N6315 Unspecified lump in the right breast, overlapping quadrants: Secondary | ICD-10-CM | POA: Diagnosis not present

## 2022-09-05 ENCOUNTER — Encounter: Payer: Self-pay | Admitting: Family Medicine

## 2022-09-05 ENCOUNTER — Ambulatory Visit (INDEPENDENT_AMBULATORY_CARE_PROVIDER_SITE_OTHER): Payer: BC Managed Care – PPO | Admitting: Family Medicine

## 2022-09-05 VITALS — BP 130/82 | HR 82 | Temp 98.9°F | Resp 17 | Ht 64.0 in | Wt 193.5 lb

## 2022-09-05 DIAGNOSIS — E559 Vitamin D deficiency, unspecified: Secondary | ICD-10-CM | POA: Diagnosis not present

## 2022-09-05 DIAGNOSIS — Z0000 Encounter for general adult medical examination without abnormal findings: Secondary | ICD-10-CM | POA: Diagnosis not present

## 2022-09-05 DIAGNOSIS — E669 Obesity, unspecified: Secondary | ICD-10-CM

## 2022-09-05 LAB — CBC WITH DIFFERENTIAL/PLATELET
Basophils Absolute: 0 10*3/uL (ref 0.0–0.1)
Basophils Relative: 0.5 % (ref 0.0–3.0)
Eosinophils Absolute: 0 10*3/uL (ref 0.0–0.7)
Eosinophils Relative: 0.7 % (ref 0.0–5.0)
HCT: 38.1 % (ref 36.0–46.0)
Hemoglobin: 12.4 g/dL (ref 12.0–15.0)
Lymphocytes Relative: 43 % (ref 12.0–46.0)
Lymphs Abs: 2.3 10*3/uL (ref 0.7–4.0)
MCHC: 32.6 g/dL (ref 30.0–36.0)
MCV: 90 fl (ref 78.0–100.0)
Monocytes Absolute: 0.4 10*3/uL (ref 0.1–1.0)
Monocytes Relative: 6.8 % (ref 3.0–12.0)
Neutro Abs: 2.7 10*3/uL (ref 1.4–7.7)
Neutrophils Relative %: 49 % (ref 43.0–77.0)
Platelets: 172 10*3/uL (ref 150.0–400.0)
RBC: 4.24 Mil/uL (ref 3.87–5.11)
RDW: 13.6 % (ref 11.5–15.5)
WBC: 5.5 10*3/uL (ref 4.0–10.5)

## 2022-09-05 LAB — LIPID PANEL
Cholesterol: 167 mg/dL (ref 0–200)
HDL: 59 mg/dL (ref >39.00)
LDL Cholesterol: 96 mg/dL (ref 0–99)
NonHDL: 108.16
Total CHOL/HDL Ratio: 3
Triglycerides: 60 mg/dL (ref 0.0–149.0)
VLDL: 12 mg/dL (ref 0.0–40.0)

## 2022-09-05 LAB — BASIC METABOLIC PANEL
BUN: 10 mg/dL (ref 6–23)
CO2: 29 mEq/L (ref 19–32)
Calcium: 9 mg/dL (ref 8.4–10.5)
Chloride: 104 mEq/L (ref 96–112)
Creatinine, Ser: 0.81 mg/dL (ref 0.40–1.20)
GFR: 85.74 mL/min (ref >60.00)
Glucose, Bld: 92 mg/dL (ref 70–99)
Potassium: 4.2 mEq/L (ref 3.5–5.1)
Sodium: 137 mEq/L (ref 135–145)

## 2022-09-05 LAB — HEPATIC FUNCTION PANEL
ALT: 12 U/L (ref 0–35)
AST: 15 U/L (ref 0–37)
Albumin: 4.2 g/dL (ref 3.5–5.2)
Alkaline Phosphatase: 39 U/L (ref 39–117)
Bilirubin, Direct: 0.2 mg/dL (ref 0.0–0.3)
Total Bilirubin: 1 mg/dL (ref 0.2–1.2)
Total Protein: 7 g/dL (ref 6.0–8.3)

## 2022-09-05 LAB — TSH: TSH: 2.42 u[IU]/mL (ref 0.35–5.50)

## 2022-09-05 LAB — VITAMIN D 25 HYDROXY (VIT D DEFICIENCY, FRACTURES): VITD: 30.02 ng/mL (ref 30.00–100.00)

## 2022-09-05 NOTE — Assessment & Plan Note (Signed)
Pt's PE WNL w/ exception of BMI.  UTD on pap, mammo, colonoscopy, Tdap, flu.  Check labs.  Anticipatory guidance provided.

## 2022-09-05 NOTE — Progress Notes (Signed)
   Subjective:    Patient ID: Felicia Gutierrez, female    DOB: 03-10-1976, 46 y.o.   MRN: [id number]  HPI CPE- UTD on pap, mammo, colonoscopy, Tdap, flu.  Patient Care Team    Relationship Specialty Notifications Start End  Midge Minium, MD PCP - General Family Medicine  01/07/15   Doroteo Glassman, MD Referring Physician Obstetrics and Gynecology  03/25/15     Health Maintenance  Topic Date Due   PAP SMEAR-Modifier  04/29/2023   MAMMOGRAM  06/13/2023   DTaP/Tdap/Td (2 - Td or Tdap) 03/24/2025   COLONOSCOPY (Pts 45-60yr Insurance coverage will need to be confirmed)  07/29/2030   INFLUENZA VACCINE  Completed   HIV Screening  Completed   HPV VACCINES  Aged Out   COVID-19 Vaccine  Discontinued   Hepatitis C Screening  Discontinued     Review of Systems Patient reports no vision/ hearing changes, adenopathy,fever, weight change,  persistant/recurrent hoarseness , swallowing issues, chest pain, palpitations, edema, persistant/recurrent cough, hemoptysis, dyspnea (rest/exertional/paroxysmal nocturnal), gastrointestinal bleeding (melena, rectal bleeding), abdominal pain, significant heartburn, bowel changes, GU symptoms (dysuria, hematuria, incontinence), Gyn symptoms (abnormal  bleeding, pain),  syncope, focal weakness, memory loss, numbness & tingling, skin/hair/nail changes, abnormal bruising or bleeding, anxiety, or depression.     Objective:   Physical Exam General Appearance:    Alert, cooperative, no distress, appears stated age  Head:    Normocephalic, without obvious abnormality, atraumatic  Eyes:    PERRL, conjunctiva/corneas clear, EOM's intact both eyes  Ears:    Normal TM's and external ear canals, both ears  Nose:   Nares normal, septum midline, mucosa normal, no drainage    or sinus tenderness  Throat:   Lips, mucosa, and tongue normal; teeth and gums normal  Neck:   Supple, symmetrical, trachea midline, no adenopathy;    Thyroid: no enlargement/tenderness/nodules   Back:     Symmetric, no curvature, ROM normal, no CVA tenderness  Lungs:     Clear to auscultation bilaterally, respirations unlabored  Chest Wall:    No tenderness or deformity   Heart:    Regular rate and rhythm, S1 and S2 normal, no murmur, rub   or gallop  Breast Exam:    Deferred to GYN  Abdomen:     Soft, non-tender, bowel sounds active all four quadrants,    no masses, no organomegaly  Genitalia:    Deferred to GYN  Rectal:    Extremities:   Extremities normal, atraumatic, no cyanosis or edema  Pulses:   2+ and symmetric all extremities  Skin:   Skin color, texture, turgor normal, no rashes or lesions  Lymph nodes:   Cervical, supraclavicular, and axillary nodes normal  Neurologic:   CNII-XII intact, normal strength, sensation and reflexes    throughout          Assessment & Plan:

## 2022-09-05 NOTE — Assessment & Plan Note (Signed)
Weight is stable and BMI is 33.21  Encouraged healthy diet and regular exercise.  Check labs to risk stratify.  Will follow.

## 2022-09-05 NOTE — Assessment & Plan Note (Signed)
Check labs and replete prn. 

## 2022-09-05 NOTE — Patient Instructions (Signed)
Follow up in 1 year or as needed  We'll notify you of your lab results and make any changes if needed Keep up the good work on healthy diet and regular exercise- you're doing great! Call with any questions or concerns Stay Safe!  Stay Healthy! Happy Holidays!!!

## 2022-09-06 ENCOUNTER — Telehealth: Payer: Self-pay

## 2022-09-06 NOTE — Telephone Encounter (Signed)
Called patient no answer, no VM available

## 2022-09-06 NOTE — Telephone Encounter (Signed)
-----   Message from Midge Minium, MD sent at 09/06/2022  4:20 PM EST ----- Labs look AMAZING!  Keep up the good work!

## 2022-09-07 ENCOUNTER — Encounter: Payer: Self-pay | Admitting: Family Medicine

## 2022-09-07 NOTE — Telephone Encounter (Signed)
Informed pt of lab results  

## 2023-04-09 DIAGNOSIS — R5383 Other fatigue: Secondary | ICD-10-CM | POA: Diagnosis not present

## 2023-04-09 DIAGNOSIS — K5900 Constipation, unspecified: Secondary | ICD-10-CM | POA: Diagnosis not present

## 2023-04-09 DIAGNOSIS — M2550 Pain in unspecified joint: Secondary | ICD-10-CM | POA: Diagnosis not present

## 2023-04-09 DIAGNOSIS — E6609 Other obesity due to excess calories: Secondary | ICD-10-CM | POA: Diagnosis not present

## 2023-04-09 DIAGNOSIS — Z3202 Encounter for pregnancy test, result negative: Secondary | ICD-10-CM | POA: Diagnosis not present

## 2023-04-12 DIAGNOSIS — Z723 Lack of physical exercise: Secondary | ICD-10-CM | POA: Diagnosis not present

## 2023-04-12 DIAGNOSIS — Z724 Inappropriate diet and eating habits: Secondary | ICD-10-CM | POA: Diagnosis not present

## 2023-04-12 DIAGNOSIS — Z713 Dietary counseling and surveillance: Secondary | ICD-10-CM | POA: Diagnosis not present

## 2023-04-23 DIAGNOSIS — K5900 Constipation, unspecified: Secondary | ICD-10-CM | POA: Diagnosis not present

## 2023-04-23 DIAGNOSIS — M5489 Other dorsalgia: Secondary | ICD-10-CM | POA: Diagnosis not present

## 2023-04-23 DIAGNOSIS — R5383 Other fatigue: Secondary | ICD-10-CM | POA: Diagnosis not present

## 2023-04-23 DIAGNOSIS — E6609 Other obesity due to excess calories: Secondary | ICD-10-CM | POA: Diagnosis not present

## 2023-04-30 DIAGNOSIS — M2550 Pain in unspecified joint: Secondary | ICD-10-CM | POA: Diagnosis not present

## 2023-04-30 DIAGNOSIS — E785 Hyperlipidemia, unspecified: Secondary | ICD-10-CM | POA: Diagnosis not present

## 2023-04-30 DIAGNOSIS — R5383 Other fatigue: Secondary | ICD-10-CM | POA: Diagnosis not present

## 2023-04-30 DIAGNOSIS — E6609 Other obesity due to excess calories: Secondary | ICD-10-CM | POA: Diagnosis not present

## 2023-05-07 DIAGNOSIS — M2550 Pain in unspecified joint: Secondary | ICD-10-CM | POA: Diagnosis not present

## 2023-05-07 DIAGNOSIS — E785 Hyperlipidemia, unspecified: Secondary | ICD-10-CM | POA: Diagnosis not present

## 2023-05-07 DIAGNOSIS — Z3202 Encounter for pregnancy test, result negative: Secondary | ICD-10-CM | POA: Diagnosis not present

## 2023-05-07 DIAGNOSIS — E6609 Other obesity due to excess calories: Secondary | ICD-10-CM | POA: Diagnosis not present

## 2023-05-07 DIAGNOSIS — R5383 Other fatigue: Secondary | ICD-10-CM | POA: Diagnosis not present

## 2023-05-24 DIAGNOSIS — E6609 Other obesity due to excess calories: Secondary | ICD-10-CM | POA: Diagnosis not present

## 2023-05-24 DIAGNOSIS — E785 Hyperlipidemia, unspecified: Secondary | ICD-10-CM | POA: Diagnosis not present

## 2023-05-24 DIAGNOSIS — K5900 Constipation, unspecified: Secondary | ICD-10-CM | POA: Diagnosis not present

## 2023-05-24 DIAGNOSIS — R5383 Other fatigue: Secondary | ICD-10-CM | POA: Diagnosis not present

## 2023-05-31 DIAGNOSIS — R5383 Other fatigue: Secondary | ICD-10-CM | POA: Diagnosis not present

## 2023-05-31 DIAGNOSIS — E785 Hyperlipidemia, unspecified: Secondary | ICD-10-CM | POA: Diagnosis not present

## 2023-05-31 DIAGNOSIS — K5900 Constipation, unspecified: Secondary | ICD-10-CM | POA: Diagnosis not present

## 2023-05-31 DIAGNOSIS — E6609 Other obesity due to excess calories: Secondary | ICD-10-CM | POA: Diagnosis not present

## 2023-06-07 DIAGNOSIS — R5383 Other fatigue: Secondary | ICD-10-CM | POA: Diagnosis not present

## 2023-06-07 DIAGNOSIS — E6609 Other obesity due to excess calories: Secondary | ICD-10-CM | POA: Diagnosis not present

## 2023-06-07 DIAGNOSIS — E785 Hyperlipidemia, unspecified: Secondary | ICD-10-CM | POA: Diagnosis not present

## 2023-06-07 DIAGNOSIS — K5900 Constipation, unspecified: Secondary | ICD-10-CM | POA: Diagnosis not present

## 2023-06-18 DIAGNOSIS — Z1231 Encounter for screening mammogram for malignant neoplasm of breast: Secondary | ICD-10-CM | POA: Diagnosis not present

## 2023-06-21 DIAGNOSIS — R5383 Other fatigue: Secondary | ICD-10-CM | POA: Diagnosis not present

## 2023-06-21 DIAGNOSIS — E6609 Other obesity due to excess calories: Secondary | ICD-10-CM | POA: Diagnosis not present

## 2023-06-21 DIAGNOSIS — K5900 Constipation, unspecified: Secondary | ICD-10-CM | POA: Diagnosis not present

## 2023-06-21 DIAGNOSIS — E785 Hyperlipidemia, unspecified: Secondary | ICD-10-CM | POA: Diagnosis not present

## 2023-07-12 DIAGNOSIS — E785 Hyperlipidemia, unspecified: Secondary | ICD-10-CM | POA: Diagnosis not present

## 2023-07-12 DIAGNOSIS — R5383 Other fatigue: Secondary | ICD-10-CM | POA: Diagnosis not present

## 2023-07-12 DIAGNOSIS — E6609 Other obesity due to excess calories: Secondary | ICD-10-CM | POA: Diagnosis not present

## 2023-07-12 DIAGNOSIS — Z3202 Encounter for pregnancy test, result negative: Secondary | ICD-10-CM | POA: Diagnosis not present

## 2023-07-12 DIAGNOSIS — K5900 Constipation, unspecified: Secondary | ICD-10-CM | POA: Diagnosis not present

## 2023-07-18 ENCOUNTER — Telehealth: Payer: Self-pay

## 2023-07-18 ENCOUNTER — Other Ambulatory Visit: Payer: Self-pay

## 2023-07-18 DIAGNOSIS — T753XXA Motion sickness, initial encounter: Secondary | ICD-10-CM

## 2023-07-18 MED ORDER — SCOPOLAMINE 1 MG/3DAYS TD PT72: 1.0000 | MEDICATED_PATCH | TRANSDERMAL | Status: DC

## 2023-07-18 MED ORDER — SCOPOLAMINE 1 MG/3DAYS TD PT72: 1.0000 | MEDICATED_PATCH | TRANSDERMAL | 0 refills | Status: DC

## 2023-07-18 NOTE — Telephone Encounter (Signed)
Ok to send scopolamine patch, #4, no refill.  Apply patch behind ear every 72hrs as needed for motion sickness

## 2023-07-18 NOTE — Telephone Encounter (Signed)
Pt has been notified Rx has been sent

## 2023-07-18 NOTE — Telephone Encounter (Signed)
Pt is requesting a motion sickness medication she is traveling via boat for a wedding soon and has had issues with motion sickness in the past however pt not seen since December please advise if pt needs a visit

## 2023-07-24 DIAGNOSIS — K5900 Constipation, unspecified: Secondary | ICD-10-CM | POA: Diagnosis not present

## 2023-07-24 DIAGNOSIS — E785 Hyperlipidemia, unspecified: Secondary | ICD-10-CM | POA: Diagnosis not present

## 2023-07-24 DIAGNOSIS — M5489 Other dorsalgia: Secondary | ICD-10-CM | POA: Diagnosis not present

## 2023-07-24 DIAGNOSIS — E6609 Other obesity due to excess calories: Secondary | ICD-10-CM | POA: Diagnosis not present

## 2023-08-07 DIAGNOSIS — K5900 Constipation, unspecified: Secondary | ICD-10-CM | POA: Diagnosis not present

## 2023-08-07 DIAGNOSIS — E785 Hyperlipidemia, unspecified: Secondary | ICD-10-CM | POA: Diagnosis not present

## 2023-08-07 DIAGNOSIS — E6609 Other obesity due to excess calories: Secondary | ICD-10-CM | POA: Diagnosis not present

## 2023-08-07 DIAGNOSIS — R5383 Other fatigue: Secondary | ICD-10-CM | POA: Diagnosis not present

## 2023-08-27 DIAGNOSIS — M2550 Pain in unspecified joint: Secondary | ICD-10-CM | POA: Diagnosis not present

## 2023-08-27 DIAGNOSIS — Z3202 Encounter for pregnancy test, result negative: Secondary | ICD-10-CM | POA: Diagnosis not present

## 2023-08-27 DIAGNOSIS — R5383 Other fatigue: Secondary | ICD-10-CM | POA: Diagnosis not present

## 2023-08-27 DIAGNOSIS — E6609 Other obesity due to excess calories: Secondary | ICD-10-CM | POA: Diagnosis not present

## 2023-08-27 DIAGNOSIS — K5900 Constipation, unspecified: Secondary | ICD-10-CM | POA: Diagnosis not present

## 2023-09-09 ENCOUNTER — Encounter: Payer: Self-pay | Admitting: Family Medicine

## 2023-09-09 ENCOUNTER — Ambulatory Visit: Payer: BC Managed Care – PPO | Admitting: Family Medicine

## 2023-09-09 VITALS — BP 98/80 | HR 69 | Temp 97.9°F | Ht 64.0 in | Wt 175.5 lb

## 2023-09-09 DIAGNOSIS — Z1159 Encounter for screening for other viral diseases: Secondary | ICD-10-CM

## 2023-09-09 DIAGNOSIS — E669 Obesity, unspecified: Secondary | ICD-10-CM | POA: Diagnosis not present

## 2023-09-09 DIAGNOSIS — Z136 Encounter for screening for cardiovascular disorders: Secondary | ICD-10-CM

## 2023-09-09 DIAGNOSIS — Z0000 Encounter for general adult medical examination without abnormal findings: Secondary | ICD-10-CM | POA: Diagnosis not present

## 2023-09-09 DIAGNOSIS — E559 Vitamin D deficiency, unspecified: Secondary | ICD-10-CM

## 2023-09-09 DIAGNOSIS — L659 Nonscarring hair loss, unspecified: Secondary | ICD-10-CM

## 2023-09-09 LAB — HEPATIC FUNCTION PANEL
ALT: 13 U/L (ref 0–35)
AST: 15 U/L (ref 0–37)
Albumin: 4.2 g/dL (ref 3.5–5.2)
Alkaline Phosphatase: 40 U/L (ref 39–117)
Bilirubin, Direct: 0.1 mg/dL (ref 0.0–0.3)
Total Bilirubin: 0.8 mg/dL (ref 0.2–1.2)
Total Protein: 6.9 g/dL (ref 6.0–8.3)

## 2023-09-09 LAB — CBC WITH DIFFERENTIAL/PLATELET
Basophils Absolute: 0 10*3/uL (ref 0.0–0.1)
Basophils Relative: 0.1 % (ref 0.0–3.0)
Eosinophils Absolute: 0 10*3/uL (ref 0.0–0.7)
Eosinophils Relative: 1 % (ref 0.0–5.0)
HCT: 39.5 % (ref 36.0–46.0)
Hemoglobin: 12.8 g/dL (ref 12.0–15.0)
Lymphocytes Relative: 39.9 % (ref 12.0–46.0)
Lymphs Abs: 2 10*3/uL (ref 0.7–4.0)
MCHC: 32.5 g/dL (ref 30.0–36.0)
MCV: 91.4 fL (ref 78.0–100.0)
Monocytes Absolute: 0.3 10*3/uL (ref 0.1–1.0)
Monocytes Relative: 6.5 % (ref 3.0–12.0)
Neutro Abs: 2.6 10*3/uL (ref 1.4–7.7)
Neutrophils Relative %: 52.5 % (ref 43.0–77.0)
Platelets: 148 10*3/uL — ABNORMAL LOW (ref 150.0–400.0)
RBC: 4.31 Mil/uL (ref 3.87–5.11)
RDW: 13.7 % (ref 11.5–15.5)
WBC: 5 10*3/uL (ref 4.0–10.5)

## 2023-09-09 LAB — BASIC METABOLIC PANEL
BUN: 13 mg/dL (ref 6–23)
CO2: 25 meq/L (ref 19–32)
Calcium: 9.1 mg/dL (ref 8.4–10.5)
Chloride: 103 meq/L (ref 96–112)
Creatinine, Ser: 0.72 mg/dL (ref 0.40–1.20)
GFR: 98.06 mL/min (ref >60.00)
Glucose, Bld: 80 mg/dL (ref 70–99)
Potassium: 4.1 meq/L (ref 3.5–5.1)
Sodium: 138 meq/L (ref 135–145)

## 2023-09-09 LAB — VITAMIN D 25 HYDROXY (VIT D DEFICIENCY, FRACTURES): VITD: 23.12 ng/mL — ABNORMAL LOW (ref 30.00–100.00)

## 2023-09-09 LAB — LIPID PANEL
Cholesterol: 185 mg/dL (ref 0–200)
HDL: 55.9 mg/dL (ref >39.00)
LDL Cholesterol: 121 mg/dL — ABNORMAL HIGH (ref 0–99)
NonHDL: 129.05
Total CHOL/HDL Ratio: 3
Triglycerides: 40 mg/dL (ref 0.0–149.0)
VLDL: 8 mg/dL (ref 0.0–40.0)

## 2023-09-09 LAB — TSH: TSH: 1.66 u[IU]/mL (ref 0.35–5.50)

## 2023-09-09 NOTE — Assessment & Plan Note (Signed)
Pt's PE WNL w/ exception of BMI.  UTD on pap, mammo, colonoscopy, immunizations.  Check labs.  Anticipatory guidance provided.  

## 2023-09-09 NOTE — Assessment & Plan Note (Signed)
Much improved!  Pt is down 20 lbs since last visit.  Applauded her efforts.  Check labs to risk stratify.

## 2023-09-09 NOTE — Assessment & Plan Note (Signed)
Check labs and replete prn. 

## 2023-09-09 NOTE — Patient Instructions (Signed)
Follow up in 1 year or as needed We'll notify you of your lab results and make any changes Continue to work on healthy diet and regular exercise- you're doing great!! We'll call you to schedule your Derm appt Call with any questions or concerns Stay Safe!  Stay Healthy! Happy Holidays!!!

## 2023-09-09 NOTE — Progress Notes (Signed)
   Subjective:    Patient ID: Felicia Gutierrez, female    DOB: 03-31-1976, 47 y.o.   MRN: [id number]  HPI CPE- pt reports UTD on mammo and flu.  UTD on Tdap, pap, colonoscopy.    Patient Care Team    Relationship Specialty Notifications Start End  Sheliah Hatch, MD PCP - General Family Medicine  01/07/15   Sharyne Richters, MD Referring Physician Obstetrics and Gynecology  03/25/15     Health Maintenance  Topic Date Due   Hepatitis C Screening  Never done   INFLUENZA VACCINE  05/02/2023   COVID-19 Vaccine (3 - 2023-24 season) 06/02/2023   MAMMOGRAM  06/13/2023   DTaP/Tdap/Td (2 - Td or Tdap) 03/24/2025   Cervical Cancer Screening (HPV/Pap Cotest)  04/25/2025   Colonoscopy  07/29/2030   HIV Screening  Completed   HPV VACCINES  Aged Out      Review of Systems Patient reports no vision/ hearing changes, adenopathy,fever,  persistant/recurrent hoarseness , swallowing issues, chest pain, palpitations, edema, persistant/recurrent cough, hemoptysis, dyspnea (rest/exertional/paroxysmal nocturnal), gastrointestinal bleeding (melena, rectal bleeding), abdominal pain, significant heartburn, bowel changes, GU symptoms (dysuria, hematuria, incontinence), Gyn symptoms (abnormal  bleeding, pain),  syncope, focal weakness, memory loss, numbness & tingling, skin/nail changes, abnormal bruising or bleeding, anxiety, or depression.   + 20 lb weight loss- has [facility]. + thinning hair    Objective:   Physical Exam General Appearance:    Alert, cooperative, no distress, appears stated age  Head:    Normocephalic, without obvious abnormality, atraumatic  Eyes:    PERRL, conjunctiva/corneas clear, EOM's intact both eyes  Ears:    Normal TM's and external ear canals, both ears  Nose:   Nares normal, septum midline, mucosa normal, no drainage    or sinus tenderness  Throat:   Lips, mucosa, and tongue normal; teeth and gums normal  Neck:   Supple, symmetrical,  trachea midline, no adenopathy;    Thyroid: no enlargement/tenderness/nodules  Back:     Symmetric, no curvature, ROM normal, no CVA tenderness  Lungs:     Clear to auscultation bilaterally, respirations unlabored  Chest Wall:    No tenderness or deformity   Heart:    Regular rate and rhythm, S1 and S2 normal, no murmur, rub   or gallop  Breast Exam:    Deferred to GYN  Abdomen:     Soft, non-tender, bowel sounds active all four quadrants,    no masses, no organomegaly  Genitalia:    Deferred to GYN  Rectal:    Extremities:   Extremities normal, atraumatic, no cyanosis or edema  Pulses:   2+ and symmetric all extremities  Skin:   Skin color, texture, turgor normal, no rashes or lesions  Lymph nodes:   Cervical, supraclavicular, and axillary nodes normal  Neurologic:   CNII-XII intact, normal strength, sensation and reflexes    throughout          Assessment & Plan:

## 2023-09-10 ENCOUNTER — Telehealth: Payer: Self-pay

## 2023-09-10 LAB — HEPATITIS C ANTIBODY: Hepatitis C Ab: NONREACTIVE

## 2023-09-10 NOTE — Telephone Encounter (Signed)
Pt has been notified. Pt has no concerns

## 2023-09-10 NOTE — Telephone Encounter (Signed)
-----   Message from Neena Rhymes sent at 09/10/2023  7:23 AM EST ----- Labs look great!  No changes at this time

## 2023-09-11 DIAGNOSIS — K5900 Constipation, unspecified: Secondary | ICD-10-CM | POA: Diagnosis not present

## 2023-09-11 DIAGNOSIS — R5383 Other fatigue: Secondary | ICD-10-CM | POA: Diagnosis not present

## 2023-09-11 DIAGNOSIS — M5489 Other dorsalgia: Secondary | ICD-10-CM | POA: Diagnosis not present

## 2023-09-11 DIAGNOSIS — E6609 Other obesity due to excess calories: Secondary | ICD-10-CM | POA: Diagnosis not present

## 2024-06-17 ENCOUNTER — Encounter: Payer: Self-pay | Admitting: Dermatology

## 2024-06-17 ENCOUNTER — Ambulatory Visit (INDEPENDENT_AMBULATORY_CARE_PROVIDER_SITE_OTHER): Admitting: Dermatology

## 2024-06-17 VITALS — BP 117/78 | HR 68

## 2024-06-17 DIAGNOSIS — L6681 Central centrifugal cicatricial alopecia: Secondary | ICD-10-CM

## 2024-06-17 DIAGNOSIS — L649 Androgenic alopecia, unspecified: Secondary | ICD-10-CM

## 2024-06-17 MED ORDER — SAFETY SEAL MISCELLANEOUS MISC: 1.0000 | Freq: Every morning | 9 refills | Status: AC

## 2024-06-17 NOTE — Patient Instructions (Addendum)
 Date: Wed Jun 17 2024  Hello Felicia Gutierrez,  Thank you for visiting today. Here is a summary of the key instructions:  - Medications:   - Use compound medication from [facility] pharmacy   - Apply a few drops in the morning   - Rub it in well on the scalp   - Continue taking current supplements  - Hair Care:   - Do not use any glue on your scalp   - Avoid tight hairstyles like braids and ponytails   - Do not use wig caps with glue  - Other Instructions:   - Consider adding collagen powder to your diet   - Vital Proteins collagen peptides recommended   - Can be mixed with coffee  - Follow-up:   - Return for follow-up appointment in 4 months  Please reach out if you have any questions or concerns.  Warm regards,  Dr. [facility]           Important Information   Due to recent changes in healthcare laws, you may see results of your pathology and/or laboratory studies on MyChart before the doctors have had a chance to review them. We understand that in some cases there may be results that are confusing or concerning to you. Please understand that not all results are received at the same time and often the doctors may need to interpret multiple results in order to provide you with the best plan of care or course of treatment. Therefore, we ask that you please give us  2 business days to thoroughly review all your results before contacting the office for clarification. Should we see a critical lab result, you will be contacted sooner.     If You Need Anything After Your Visit   If you have any questions or concerns for your doctor, please call our main line at [phone]. If no one answers, please leave a voicemail as directed and we will return your call as soon as possible. Messages left after 4 pm will be answered the following business day.    You may also send us  a message via MyChart. We typically respond to MyChart messages within 1-2 business days.  For  prescription refills, please ask your pharmacy to contact our office. Our fax number is [phone].  If you have an urgent issue when the clinic is closed that cannot wait until the next business day, you can page your doctor at the number below.     Please note that while we do our best to be available for urgent issues outside of office hours, we are not available 24/7.    If you have an urgent issue and are unable to reach us , you may choose to seek medical care at your doctor's office, retail clinic, urgent care center, or emergency room.   If you have a medical emergency, please immediately call 911 or go to the emergency department. In the event of inclement weather, please call our main line at [phone] for an update on the status of any delays or closures.  Dermatology Medication Tips: Please keep the boxes that topical medications come in in order to help keep track of the instructions about where and how to use these. Pharmacies typically print the medication instructions only on the boxes and not directly on the medication tubes.   If your medication is too expensive, please contact our office at [phone] or send us  a message through MyChart.    We are unable to tell what  your co-pay for medications will be in advance as this is different depending on your insurance coverage. However, we may be able to find a substitute medication at lower cost or fill out paperwork to get insurance to cover a needed medication.    If a prior authorization is required to get your medication covered by your insurance company, please allow us  1-2 business days to complete this process.   Drug prices often vary depending on where the prescription is filled and some pharmacies may offer cheaper prices.   The website www.goodrx.com contains coupons for medications through different pharmacies. The prices here do not account for what the cost may be with help from insurance (it may be cheaper  with your insurance), but the website can give you the price if you did not use any insurance.  - You can print the associated coupon and take it with your prescription to the pharmacy.  - You may also stop by our office during regular business hours and pick up a GoodRx coupon card.  - If you need your prescription sent electronically to a different pharmacy, notify our office through [facility] or by phone at [phone]

## 2024-06-17 NOTE — Progress Notes (Signed)
   New Patient Visit   Subjective  Felicia Gutierrez is a 48 y.o. female who presents for the following: Hair Loss  Patient states she has hair loss located at the scalp that she would like to have examined. Patient reports the areas have been there for 3 years. She reports the areas are bothersome. Patient reports her scalp gets very itchy. Patient rates irritation 7 out of 10. She states that the areas have spread. Patient reports she has not previously been treated for these areas. Patient reports her previous hair care practices such as relaxers, ponytails, and heat (Flat ironing and blow drying).  The patient has spots, moles and lesions to be evaluated, some may be new or changing and the patient may have concern these could be cancer.   The following portions of the chart were reviewed this encounter and updated as appropriate: medications, allergies, medical history  Review of Systems:  No other skin or systemic complaints except as noted in HPI or Assessment and Plan.  Objective  Well appearing patient in no apparent distress; mood and affect are within normal limits.  A focused examination was performed of the following areas: Scalp  Relevant exam findings are noted in the Assessment and Plan.                     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 CCCA evidenced by central scalp hair loss that has spread outward, with smooth areas indicating scarred follicles. Androgenetic component suggested by age-related thinning and family history of female pattern baldness. Contributing factors include history of tight hairstyles and use of wig cap adhesives. Previous treatment with over-the-counter minoxidil for approximately one year showed some efficacy. - Plan:    Prescribe compound  medication containing minoxidil 8%, finasteride, and clobetasol    Apply a few drops of compound medication to scalp and rub in well every morning    Discontinue use of any hair adhesives or glue on scalp    Continue current supplement regimen    Recommend addition of collagen powder to diet    Educate patient on progressive nature of condition and importance of consistent, long-term treatment    Discuss expected timeline for visible results with early signs at 4 months and full results at 1 year  Follow-up in 4 months to assess treatment efficacy.    Long term medication management.  Patient is using long term (months to years) prescription medication  to control their dermatologic condition.  These medications require periodic monitoring to evaluate for efficacy and side effects and may require periodic laboratory monitoring.     Return in about 5 months (around 11/17/2024) for Androgenetic Alopecia and Traction Alopecia F/U.  I, Jetta Ager, am acting as Neurosurgeon for [employer], DO.  Documentation: I have reviewed the above documentation for accuracy and completeness, and I agree with the above.  Delon Lenis, DO

## 2024-09-09 ENCOUNTER — Encounter: Payer: Self-pay | Admitting: Family Medicine

## 2024-09-09 ENCOUNTER — Ambulatory Visit: Payer: BC Managed Care – PPO | Admitting: Family Medicine

## 2024-09-09 VITALS — BP 126/70 | HR 71 | Temp 98.0°F | Resp 16 | Ht 63.0 in | Wt 167.0 lb

## 2024-09-09 DIAGNOSIS — Z23 Encounter for immunization: Secondary | ICD-10-CM

## 2024-09-09 DIAGNOSIS — E559 Vitamin D deficiency, unspecified: Secondary | ICD-10-CM

## 2024-09-09 DIAGNOSIS — E663 Overweight: Secondary | ICD-10-CM | POA: Insufficient documentation

## 2024-09-09 DIAGNOSIS — Z0000 Encounter for general adult medical examination without abnormal findings: Secondary | ICD-10-CM

## 2024-09-09 LAB — HEPATIC FUNCTION PANEL
ALT: 11 U/L (ref 0–35)
AST: 16 U/L (ref 0–37)
Albumin: 4.2 g/dL (ref 3.5–5.2)
Alkaline Phosphatase: 34 U/L — ABNORMAL LOW (ref 39–117)
Bilirubin, Direct: 0.2 mg/dL (ref 0.0–0.3)
Total Bilirubin: 0.9 mg/dL (ref 0.2–1.2)
Total Protein: 6.8 g/dL (ref 6.0–8.3)

## 2024-09-09 LAB — LIPID PANEL
Cholesterol: 182 mg/dL (ref 0–200)
HDL: 65.3 mg/dL (ref >39.00)
LDL Cholesterol: 108 mg/dL — ABNORMAL HIGH (ref 0–99)
NonHDL: 116.82
Total CHOL/HDL Ratio: 3
Triglycerides: 45 mg/dL (ref 0.0–149.0)
VLDL: 9 mg/dL (ref 0.0–40.0)

## 2024-09-09 LAB — BASIC METABOLIC PANEL WITH GFR
BUN: 11 mg/dL (ref 6–23)
CO2: 27 meq/L (ref 19–32)
Calcium: 9.1 mg/dL (ref 8.4–10.5)
Chloride: 106 meq/L (ref 96–112)
Creatinine, Ser: 0.7 mg/dL (ref 0.40–1.20)
GFR: 100.72 mL/min (ref >60.00)
Glucose, Bld: 82 mg/dL (ref 70–99)
Potassium: 4.2 meq/L (ref 3.5–5.1)
Sodium: 138 meq/L (ref 135–145)

## 2024-09-09 LAB — CBC WITH DIFFERENTIAL/PLATELET
Basophils Absolute: 0 K/uL (ref 0.0–0.1)
Basophils Relative: 0.3 % (ref 0.0–3.0)
Eosinophils Absolute: 0.1 K/uL (ref 0.0–0.7)
Eosinophils Relative: 1.6 % (ref 0.0–5.0)
HCT: 38.2 % (ref 36.0–46.0)
Hemoglobin: 12.5 g/dL (ref 12.0–15.0)
Lymphocytes Relative: 42.5 % (ref 12.0–46.0)
Lymphs Abs: 1.6 K/uL (ref 0.7–4.0)
MCHC: 32.6 g/dL (ref 30.0–36.0)
MCV: 91 fl (ref 78.0–100.0)
Monocytes Absolute: 0.3 K/uL (ref 0.1–1.0)
Monocytes Relative: 7.4 % (ref 3.0–12.0)
Neutro Abs: 1.9 K/uL (ref 1.4–7.7)
Neutrophils Relative %: 48.2 % (ref 43.0–77.0)
Platelets: 172 K/uL (ref 150.0–400.0)
RBC: 4.2 Mil/uL (ref 3.87–5.11)
RDW: 14 % (ref 11.5–15.5)
WBC: 3.8 K/uL — ABNORMAL LOW (ref 4.0–10.5)

## 2024-09-09 LAB — VITAMIN D 25 HYDROXY (VIT D DEFICIENCY, FRACTURES): VITD: 19.11 ng/mL — ABNORMAL LOW (ref 30.00–100.00)

## 2024-09-09 LAB — TSH: TSH: 1.85 u[IU]/mL (ref 0.35–5.50)

## 2024-09-09 NOTE — Progress Notes (Unsigned)
° °  Subjective:    Patient ID: Felicia Gutierrez, female    DOB: 03/30/1976, 48 y.o.   MRN: [id number]  HPI CPE- due for mammo (plans to schedule), UTD on Tdap, pap, colonoscopy.  Plans for flu and PNA today  Health Maintenance  Topic Date Due   Hepatitis B Vaccines 19-59 Average Risk (1 of 3 - 19+ 3-dose series) Never done   Mammogram  06/13/2023   Pneumococcal Vaccine: 50+ Years (1 of 1 - PCV) Never done   Zoster Vaccines- Shingrix (1 of 2) Never done   Influenza Vaccine  05/01/2024   COVID-19 Vaccine (3 - 2025-26 season) 06/01/2024   DTaP/Tdap/Td (2 - Td or Tdap) 03/24/2025   Cervical Cancer Screening (HPV/Pap Cotest)  04/25/2025   Colonoscopy  07/29/2030   Hepatitis C Screening  Completed   HIV Screening  Completed   HPV VACCINES  Aged Out   Meningococcal B Vaccine  Aged Out    Patient Care Team    Relationship Specialty Notifications Start End  Mahlon Comer BRAVO, MD PCP - General Family Medicine  01/07/15   Bernerd Dade, MD Referring Physician Obstetrics and Gynecology  03/25/15       Review of Systems Patient reports no vision/ hearing changes, adenopathy,fever, persistant/recurrent hoarseness , swallowing issues, chest pain, palpitations, edema, persistant/recurrent cough, hemoptysis, dyspnea (rest/exertional/paroxysmal nocturnal), gastrointestinal bleeding (melena, rectal bleeding), abdominal pain, significant heartburn, bowel changes, GU symptoms (dysuria, hematuria, incontinence), Gyn symptoms (abnormal  bleeding, pain),  syncope, focal weakness, memory loss, numbness & tingling, skin/hair/nail changes, abnormal bruising or bleeding, anxiety, or depression.   + 10 lb weight loss    Objective:   Physical Exam General Appearance:    Alert, cooperative, no distress, appears stated age  Head:    Normocephalic, without obvious abnormality, atraumatic  Eyes:    PERRL, conjunctiva/corneas clear, EOM's intact both eyes  Ears:    Normal TM's and external ear canals, both  ears  Nose:   Nares normal, septum midline, mucosa normal, no drainage    or sinus tenderness  Throat:   Lips, mucosa, and tongue normal; teeth and gums normal  Neck:   Supple, symmetrical, trachea midline, no adenopathy;    Thyroid : no enlargement/tenderness/nodules  Back:     Symmetric, no curvature, ROM normal, no CVA tenderness  Lungs:     Clear to auscultation bilaterally, respirations unlabored  Chest Wall:    No tenderness or deformity   Heart:    Regular rate and rhythm, S1 and S2 normal, no murmur, rub   or gallop  Breast Exam:    Deferred to GYN  Abdomen:     Soft, non-tender, bowel sounds active all four quadrants,    no masses, no organomegaly  Genitalia:    Deferred to GYN  Rectal:    Extremities:   Extremities normal, atraumatic, no cyanosis or edema  Pulses:   2+ and symmetric all extremities  Skin:   Skin color, texture, turgor normal, no rashes or lesions  Lymph nodes:   Cervical, supraclavicular, and axillary nodes normal  Neurologic:   CNII-XII intact, normal strength, sensation and reflexes    throughout          Assessment & Plan:

## 2024-09-09 NOTE — Patient Instructions (Signed)
Follow up in 1 year or as needed We'll notify you of your lab results and make any changes if needed Keep up the good work on healthy diet and regular exercise- you look great! Call with any questions or concerns Stay Safe! Stay Healthy! Happy Holidays!!! 

## 2024-09-10 ENCOUNTER — Ambulatory Visit: Payer: Self-pay | Admitting: Family Medicine

## 2024-09-10 MED ORDER — VITAMIN D (ERGOCALCIFEROL) 1.25 MG (50000 UNIT) PO CAPS: 50000.0000 [IU] | ORAL_CAPSULE | ORAL | 0 refills | Status: AC

## 2024-09-12 NOTE — Assessment & Plan Note (Signed)
 Pt's PE WNL w/ exception of BMI.  UTD on pap, colonoscopy, Tdap.  Plans to schedule mammo.  Flu and PNA given today.  Check labs.  Anticipatory guidance provided.

## 2024-10-05 LAB — HM MAMMOGRAPHY

## 2024-10-14 ENCOUNTER — Encounter: Payer: Self-pay | Admitting: Family Medicine

## 2024-10-16 ENCOUNTER — Telehealth: Payer: Self-pay | Admitting: Family Medicine

## 2024-10-16 NOTE — Telephone Encounter (Signed)
 Type of form received: [facility]   Additional comments:   Received by: Fax  Form should be Faxed/mailed to: (address/ fax #) [phone]  Is patient requesting call for pickup:  Form placed:  Provider bin  Attach charge sheet.  Provider will determine charge.  Individual made aware of 3-5 business day turn around Yes?

## 2024-10-16 NOTE — Telephone Encounter (Signed)
 Forms were sign by provider and fax.

## 2024-11-17 ENCOUNTER — Ambulatory Visit: Admitting: Dermatology

## 2025-09-10 ENCOUNTER — Encounter: Admitting: Family Medicine
